# Patient Record
Sex: Male | Born: 1950 | Race: Black or African American | Hispanic: No | Marital: Single | Smoking: Current every day smoker
Health system: Southern US, Community
[De-identification: ages and names within clinical notes are randomized; demographics above are authoritative.]

## PROBLEM LIST (undated history)

## (undated) DIAGNOSIS — E7800 Pure hypercholesterolemia, unspecified: Secondary | ICD-10-CM

## (undated) DIAGNOSIS — I2510 Atherosclerotic heart disease of native coronary artery without angina pectoris: Secondary | ICD-10-CM

## (undated) DIAGNOSIS — F1910 Other psychoactive substance abuse, uncomplicated: Secondary | ICD-10-CM

## (undated) DIAGNOSIS — I255 Ischemic cardiomyopathy: Secondary | ICD-10-CM

## (undated) DIAGNOSIS — G459 Transient cerebral ischemic attack, unspecified: Secondary | ICD-10-CM

## (undated) DIAGNOSIS — I219 Acute myocardial infarction, unspecified: Secondary | ICD-10-CM

## (undated) DIAGNOSIS — I639 Cerebral infarction, unspecified: Secondary | ICD-10-CM

## (undated) DIAGNOSIS — I509 Heart failure, unspecified: Secondary | ICD-10-CM

## (undated) DIAGNOSIS — D649 Anemia, unspecified: Secondary | ICD-10-CM

## (undated) DIAGNOSIS — Z8679 Personal history of other diseases of the circulatory system: Secondary | ICD-10-CM

## (undated) HISTORY — PX: MITRAL VALVE REPLACEMENT: SHX147

---

## 1999-02-22 ENCOUNTER — Emergency Department (HOSPITAL_COMMUNITY): Admission: EM | Admit: 1999-02-22 | Discharge: 1999-02-22 | Payer: Self-pay | Admitting: *Deleted

## 1999-02-22 ENCOUNTER — Encounter: Payer: Self-pay | Admitting: *Deleted

## 2004-08-13 DIAGNOSIS — I219 Acute myocardial infarction, unspecified: Secondary | ICD-10-CM

## 2004-08-13 HISTORY — DX: Acute myocardial infarction, unspecified: I21.9

## 2004-08-24 ENCOUNTER — Emergency Department (HOSPITAL_COMMUNITY): Admission: EM | Admit: 2004-08-24 | Discharge: 2004-08-25 | Payer: Self-pay | Admitting: Emergency Medicine

## 2004-09-03 ENCOUNTER — Emergency Department (HOSPITAL_COMMUNITY): Admission: EM | Admit: 2004-09-03 | Discharge: 2004-09-03 | Payer: Self-pay | Admitting: Emergency Medicine

## 2004-12-19 ENCOUNTER — Ambulatory Visit: Payer: Self-pay | Admitting: Internal Medicine

## 2004-12-19 ENCOUNTER — Ambulatory Visit: Payer: Self-pay | Admitting: Dentistry

## 2004-12-19 ENCOUNTER — Inpatient Hospital Stay (HOSPITAL_COMMUNITY): Admission: EM | Admit: 2004-12-19 | Discharge: 2005-01-31 | Payer: Self-pay | Admitting: Emergency Medicine

## 2004-12-20 ENCOUNTER — Ambulatory Visit: Payer: Self-pay | Admitting: Cardiology

## 2005-01-17 ENCOUNTER — Ambulatory Visit: Payer: Self-pay | Admitting: Internal Medicine

## 2005-03-21 ENCOUNTER — Emergency Department (HOSPITAL_COMMUNITY): Admission: EM | Admit: 2005-03-21 | Discharge: 2005-03-21 | Payer: Self-pay | Admitting: Emergency Medicine

## 2011-10-24 ENCOUNTER — Emergency Department (HOSPITAL_COMMUNITY)
Admission: EM | Admit: 2011-10-24 | Discharge: 2011-10-24 | Disposition: A | Discharge status: Home / Self Care | Attending: Emergency Medicine | Admitting: Emergency Medicine

## 2011-10-24 ENCOUNTER — Emergency Department (HOSPITAL_COMMUNITY)

## 2011-10-24 ENCOUNTER — Encounter (HOSPITAL_COMMUNITY): Payer: Self-pay | Admitting: Emergency Medicine

## 2011-10-24 ENCOUNTER — Other Ambulatory Visit: Payer: Self-pay

## 2011-10-24 DIAGNOSIS — R202 Paresthesia of skin: Secondary | ICD-10-CM

## 2011-10-24 DIAGNOSIS — I2510 Atherosclerotic heart disease of native coronary artery without angina pectoris: Secondary | ICD-10-CM | POA: Insufficient documentation

## 2011-10-24 DIAGNOSIS — I252 Old myocardial infarction: Secondary | ICD-10-CM | POA: Insufficient documentation

## 2011-10-24 DIAGNOSIS — I671 Cerebral aneurysm, nonruptured: Secondary | ICD-10-CM

## 2011-10-24 DIAGNOSIS — I10 Essential (primary) hypertension: Secondary | ICD-10-CM | POA: Insufficient documentation

## 2011-10-24 DIAGNOSIS — R209 Unspecified disturbances of skin sensation: Secondary | ICD-10-CM | POA: Insufficient documentation

## 2011-10-24 DIAGNOSIS — Z8673 Personal history of transient ischemic attack (TIA), and cerebral infarction without residual deficits: Secondary | ICD-10-CM | POA: Insufficient documentation

## 2011-10-24 HISTORY — DX: Anemia, unspecified: D64.9

## 2011-10-24 HISTORY — DX: Heart failure, unspecified: I50.9

## 2011-10-24 HISTORY — DX: Ischemic cardiomyopathy: I25.5

## 2011-10-24 HISTORY — DX: Acute myocardial infarction, unspecified: I21.9

## 2011-10-24 HISTORY — DX: Cerebral infarction, unspecified: I63.9

## 2011-10-24 HISTORY — DX: Transient cerebral ischemic attack, unspecified: G45.9

## 2011-10-24 HISTORY — DX: Pure hypercholesterolemia, unspecified: E78.00

## 2011-10-24 HISTORY — DX: Atherosclerotic heart disease of native coronary artery without angina pectoris: I25.10

## 2011-10-24 HISTORY — DX: Other psychoactive substance abuse, uncomplicated: F19.10

## 2011-10-24 HISTORY — DX: Personal history of other diseases of the circulatory system: Z86.79

## 2011-10-24 LAB — BASIC METABOLIC PANEL
CO2: 25 mEq/L (ref 19–32)
Chloride: 103 mEq/L (ref 96–112)
Sodium: 140 mEq/L (ref 135–145)

## 2011-10-24 LAB — DIFFERENTIAL
Basophils Absolute: 0 10*3/uL (ref 0.0–0.1)
Lymphocytes Relative: 33 % (ref 12–46)
Lymphs Abs: 3.7 10*3/uL (ref 0.7–4.0)
Neutro Abs: 6.5 10*3/uL (ref 1.7–7.7)

## 2011-10-24 LAB — RAPID URINE DRUG SCREEN, HOSP PERFORMED
Amphetamines: NOT DETECTED
Benzodiazepines: NOT DETECTED
Cocaine: NOT DETECTED
Opiates: NOT DETECTED
Tetrahydrocannabinol: POSITIVE — AB

## 2011-10-24 LAB — CBC
HCT: 50.9 % (ref 39.0–52.0)
Platelets: 220 10*3/uL (ref 150–400)
RBC: 5.68 MIL/uL (ref 4.22–5.81)
RDW: 12.9 % (ref 11.5–15.5)
WBC: 11.2 10*3/uL — ABNORMAL HIGH (ref 4.0–10.5)

## 2011-10-24 MED ORDER — HYDROCHLOROTHIAZIDE 12.5 MG PO TABS: 25.0000 mg | ORAL_TABLET | Freq: Every day | ORAL | Status: AC

## 2011-10-24 MED ORDER — GADOBENATE DIMEGLUMINE 529 MG/ML IV SOLN
17.0000 mL | Freq: Once | INTRAVENOUS | Status: AC | PRN
  Administered 2011-10-24: 17 mL via INTRAVENOUS

## 2011-10-24 NOTE — Discharge Instructions (Signed)
Important to keep your blood pressure in control. Start blood pressure medication but follow up with your primary care doctor in the near future for blood pressure recheck. Call Dr. Sueanne Margarita office tomorrow morning to schedule close follow up. Return to ER for changing or worsening of symptoms.  Intracerebral Aneurysm An intracerebral aneurysm is a small, thin, walled out-pouching or enlargement of one of the large blood vessels that supply blood to the brain. Aneurysms are a risk to health because they may leak or break and bleed into the brain. They often occur where the larger vessels branch near the base of the brain. Leaking or rupture can also occur into the fluid-filled spaces that surround the brain (the subarachnoid space).  Rupture of an aneurysm leads to subarachnoid hemorrhage [facility]). This occurs most often in patients between 53 and 61 years of age. It occurs equally between men and women. Cigarette smoking and excess alcohol use has been shown to increase the risk of rupture. Intracerebral aneurysms are associated with other kidney, blood vessel, and muscle diseases. High blood pressure is also a risk factor, but it seems to be less important, since aneurysms often occur in persons with normal blood pressure. Pregnancy has not been associated with an increased incidence of this problem. CAUSES   An accident (trauma), infection, or cancer disease.   A problem with development of the lining of the artery. This causes a thinning that is weaker than the rest of the artery. This can rupture or leak.   A small percentage the problem is passed down from parents (hereditary).  SYMPTOMS   Prior to rupture, most aneurysms produce no problems. Sometimes, if the aneurysm is getting large, it may cause problems due to increased pressure on the brain. Some of these problems may be:   Double vision.   Loss of vision.   Numbness or pain in the face.   Enlarged pupil size.   A drooping eyelid.     Headache.   Usually, patients who have an aneurysm rupture experience sudden onset of a severe headache, often described as "the worst headache of my life." This can happen along with the loss of consciousness (fainting) and sometimes vomiting. A stiff neck often follows.   Rupture of an aneurysm usually occurs while the person is active rather than during sleep. In as many as half of cases, patients experience a warning headache due to a small leakage of blood. This can precede a major bleed by several hours to several weeks.   These milder headaches are often associated with feeling sick to your stomach (nausea) and vomiting. These warning headaches may be mistaken for migraine headaches.  DIAGNOSIS   A CT scan of the head will usually confirm blood within the brain or in the space surrounding the brain. The blood will show up if an aneurysm has already bled or ruptured.   If the results of the CT scan are not certain, a spinal tap is sometimes used. This test looks for blood in the fluid which surrounds the brain and spinal cord (cerebrospinal fluid).   One way to diagnose an intracerebral aneurysm is to perform a test called Carotid and vertebral angiography. This is a test in which a liquid called a "contrast material" is injected into one of the arteries leading to your head. The contrast material makes the arteries look bright white on x-rays. The outlines of the arteries and any aneurysm will show up clearly. Angiography can be done in the CT scanner (  CT Angiography) or by puncturing the artery in the groin, advancing a catheter, and then injecting the contrast material.   Magnetic resonance imaging (MRI) and magnetic resonance angiography (MRA) are also used in detection of aneurysms.  RISKS AND COMPLICATIONS  If rupture occurs, about  of the patients survive. The best predictor of risk of rupture is the size of the aneurysm.   A guide to the likely outcome (prognosis) is determined  by the patient's level of consciousness and neurologic problems when first examined upon arrival to the hospital.  After a rupture of an intracerebral aneurysm, the following may occur:  The cerebral blood vessels get smaller in size (cerebral vasospasm). This can decrease the blood supply to parts of the brain, causing a stroke.   Re-bleeding from the aneurysm.   Swelling of the ventricles in the brain (hydrocephalus).   Seizures (convulsions).   Rupture of an aneurysm can also affect heart and lung function. If these problems develop, they are assessed and treated.  All patients are monitored for the possible development of these problems. Treatment varies depending on what complications do or do not develop. TREATMENT FOR RUPTURED ANEURYSMS  Treatment of aneurysms is done with either conventional surgery or by an endovascular procedure.   Timing of treatment is an important factor in the prevention of complications. Successful early treatment of a ruptured aneurysm (within the first 3 days of a bleed) helps to prevent re-bleeding and vasospasm.   In some cases, there may be a reason to treat later (10-14 days after a rupture). Many things are considered when making this decision. These include an assessment of how much brain swelling is present. Each case is handled individually.  TREATMENT FOR UNRUPTURED ANEURYSMS Treatment of an aneurysm that is causing no problems (asymptomatic aneurysm), but was discovered by chance is complex. Many things must be considered. These include the size and exact location of the aneurysm. Small aneurysms in certain locations of the brain have a very low chance of bleeding or rupturing. Other things to consider are the patient's age, overall health, and their feelings and personal preferences. Each individual case is different. In general, if someone has an aneurysm that has not bled in the past, the risk of an operation is greater than the risk of watching and  monitoring. In those cases, the risk of an operation increases as the patient gets older. Surgery is usually recommended for large, easy to reach surgically, unruptured aneurysms. Again, each case must be assessed individually. Document Released: 07/20/2002 Document Revised: 07/19/2011 Document Reviewed: 11/17/2008 [facility] Patient Information 2012 [place], [place].  Hypertension Hypertension is another name for high blood pressure. High blood pressure may mean that your heart needs to work harder to pump blood. Blood pressure consists of two numbers, which includes a higher number over a lower number (example: 110/72). HOME CARE   Make lifestyle changes as told by your doctor. This may include weight loss and exercise.   Take your blood pressure medicine every day.   Limit how much salt you use.   Stop smoking if you smoke.   Do not use drugs.   Talk to your doctor if you are using decongestants or birth control pills. These medicines might make blood pressure higher.   Females should not drink more than 1 alcoholic drink per day. Males should not drink more than 2 alcoholic drinks per day.   See your doctor as told.  GET HELP RIGHT AWAY IF:   You have a blood  pressure reading with a top number of 180 or higher.   You get a very bad headache.   You get blurred or changing vision.   You feel confused.   You feel weak, numb, or faint.   You get chest or belly (abdominal) pain.   You throw up (vomit).   You cannot breathe very well.  MAKE SURE YOU:   Understand these instructions.   Will watch your condition.   Will get help right away if you are not doing well or get worse.  Document Released: 01/16/2008 Document Revised: 07/19/2011 Document Reviewed: 01/16/2008 [facility] Patient Information 2012 [place], [place].

## 2011-10-24 NOTE — ED Provider Notes (Signed)
History     CSN: [id number]  Arrival date & time 10/24/11  1219   First MD Initiated Contact with Patient 10/24/11 1519      Chief Complaint  Patient presents with  . Weakness    rt arm weak for 4 days now no injury denies any weakness to leg ot any other area alert x4, no slurred speech, no headache, able to move arm strong pulses     (Consider location/radiation/quality/duration/timing/severity/associated sxs/prior treatment) HPI  Patient with hx of stroke, endocarditis, MI and hx of polysubstance abuse is brought to ER by his niece with concern of a 4 day hx of gradual onset right upper arm pain with movement and a 2 day hx of waxing and waning, intermittent tingling in finger tips of right hand. Patient denies known injury or strain to right arm but states "it has been hurting in the bicep with movement." patient states he told his niece today about the pain and the waxing and waning of tingling in his right finger tips and she became concerned with hx of stroke and brought her to ER. He denies fevers, chills, HA, visual changes, dizziness, neck pain or stiffness, rash, CP, SOB, abdominal pain, extremity weakness, facial droop, slurred speech, confused thoughts, difficulty ambulating or loss of coordination. Patient states pain is aggravated by movement of elbow and shoulder. Denies aggravating or alleviating factors for tingling in finger tips. Has taken nothing for pain PTA.   Past Medical History  Diagnosis Date  . Hypercholesteremia   . Transient ischemic attack, acute   . Hypertension   . Coronary artery disease   . Myocardial infarction 2006    Past Surgical History  Procedure Date  . Mitral valve replacement     No family history on file.  History  Substance Use Topics  . Smoking status: Not on file  . Smokeless tobacco: Not on file  . Alcohol Use:       Review of Systems  All other systems reviewed and are negative.    Allergies  Review of patient's  allergies indicates no known allergies.  Home Medications  No current outpatient prescriptions on file.  BP 154/101  Pulse 79  Temp(Src) 99.5 F (37.5 C) (Oral)  Resp 20  SpO2 98%  Physical Exam  Nursing note and vitals reviewed. Constitutional: He is oriented to person, place, and time. He appears well-developed and well-nourished. No distress.  HENT:  Head: Normocephalic and atraumatic.  Eyes: Conjunctivae and EOM are normal. Pupils are equal, round, and reactive to light.  Neck: Normal range of motion. Neck supple.  Cardiovascular: Normal rate, regular rhythm, normal heart sounds and intact distal pulses.  Exam reveals no gallop and no friction rub.   No murmur heard. Pulmonary/Chest: Effort normal and breath sounds normal. No respiratory distress. He has no wheezes. He has no rales. He exhibits no tenderness.  Abdominal: Soft. Bowel sounds are normal. He exhibits no distension and no mass. There is no tenderness. There is no rebound and no guarding.  Musculoskeletal: Normal range of motion. He exhibits tenderness. He exhibits no edema.       Mild TTP of right bicep/soft tissue of right upper arm but no skin changes, erythema, edema or crepitous. No TTP of right shoulder or elbow. FROM of right upper extremity with little or no pain. 5/5 strength bilaterally of UE and LE. Good reflexes.   Neurological: He is alert and oriented to person, place, and time. No cranial nerve deficit. Coordination normal.  Skin: Skin is warm and dry. No rash noted. He is not diaphoretic. No erythema.  Psychiatric: He has a normal mood and affect.    ED Course  Procedures (including critical care time)   Date: 10/24/2011  Rate:   Rhythm: normal sinus rhythm  QRS Axis: normal  Intervals: normal  ST/T Wave abnormalities: non specific T wave changes.   Conduction Disutrbances: none  Narrative Interpretation:   Old EKG Reviewed: non provocative EKG compared to Jan 10, 2005, No significant changes  noted    Labs Reviewed  CBC - Abnormal; Notable for the following:    WBC 11.2 (*)    Hemoglobin 17.7 (*)    All other components within normal limits  URINE RAPID DRUG SCREEN (HOSP PERFORMED) - Abnormal; Notable for the following:    Tetrahydrocannabinol POSITIVE (*)    All other components within normal limits  DIFFERENTIAL  BASIC METABOLIC PANEL  TROPONIN I   Mr Shirlee Latch Wo Contrast  10/24/2011  *RADIOLOGY REPORT*  Clinical Data: Right arm weakness.  Abnormal MRI of the brain.  MRA HEAD WITHOUT CONTRAST  Technique: Angiographic images of the Circle of Willis were obtained using MRA technique without intravenous contrast.  Comparison: MRI same day  Findings: This study was done after previous administration of contrast.  Both internal carotid arteries are patent into the brain.  The right internal carotid artery supplies only the right middle cerebral artery territory.  There is no right MCA stenosis or aneurysm.  The left internal carotid artery supplies the left middle cerebral artery territory and both anterior cerebral artery territories. 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  Both vertebral arteries are widely patent to the basilar.  No basilar stenosis.  Posterior circulation branch vessels are patent.  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  Original Report Authenticated By: Thomasenia Sales, M.D.   Mr Laqueta Jean RU Contrast  10/24/2011  *RADIOLOGY REPORT*  Clinical Data: Right arm weakness.  History of previous infarct.  MRI HEAD WITHOUT AND WITH CONTRAST  Technique:  Multiplanar, multiecho pulse sequences of the brain and surrounding structures were obtained according to standard protocol without and with intravenous contrast  Contrast: 17mL MULTIHANCE GADOBENATE DIMEGLUMINE 529 MG/ML IV SOLN  Comparison: CT head without contrast 01/08/2005.  MRI brain 12/20/2004.  Findings: The diffusion weighted images  demonstrate no evidence for acute or subacute infarction.  Multiple remote infarcts, including the right occipital lobe, bilateral thalami, right cerebellum, and left frontal lobe are again noted.  The signal changes have evolved since the acute imaging.  No hemorrhage or mass lesion is present.  Mild white matter changes are present within the brain stem, potentially representing wallerian degeneration.  Flow is present in the major intracranial arteries.  The globes and orbits are intact.  The paranasal sinuses and mastoid air cells are clear.  A 6 mm rounded area of enhancement is noted just anterior to the corpus callosum on image 38 of series 10.  This appears to be along the course of the pericallosal artery and is concerning for a small aneurysm.  The area is subjacent to the focal remote medial left frontal lobe infarct.  The aneurysm is just below the superior limb of the field of view from the prior MRI.  IMPRESSION:  1.  No acute infarct. 2.  Evolution of multiple bilateral remote infarcts. 3.  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  Original Report Authenticated By: Jamesetta Orleans. MATTERN, M.D.     No diagnosis found.  8:23 PM I spoke with Dr. Jeanne Ivan who is oncall for neurology for [facility] who states MRA results and symptoms are not consistent with TIA and from a TIA/stroke stand point that the patient needs BP medication and close follow up with PCP and neurology but that the aneurysm needs to be discussed with neurosurg to discuss follow up. Patient denies HA or weakness of arm. Ongoing tingling in fingers but no weakness. Dr. Jeanne Ivan states symptoms are much more consistent with possible "pinched nerve in neck."   Dr. Clarene Duke to speak with oncall neurosurg.   9:03 PM Dr. Franky Macho requesting that patient call his office first thing in the morning to establish close followup for ongoing evaluation and management of  his aneurysm which appears stable on MRA. Patient is agreeable to plan.   MDM  Patient's tingling in fingertips more likely to be associated with a questionable pinched nerve versus a TIA with pain in MCA and carotid arteries. No acute findings of stroke on MRI MRA. Stable appearing aneurysm be followed by a neurosurgery closely. He has no neuro focal findings. He is ambulating without difficulty and without ataxia.        Jenness Corner, [place] 10/24/11 2104

## 2011-10-24 NOTE — ED Notes (Signed)
Pt's niece came to the desk, reports that pt has had TIA in the past.  Pt is reporting tingling in his R arm.  States that pt probably needs to be be ruled out for TIA again.  Tiffany EDPA and Daine CN notified.

## 2011-10-24 NOTE — ED Notes (Signed)
Transported back from MRI

## 2011-10-24 NOTE — ED Notes (Signed)
Patient transported to MRI per [facility] triage nurse

## 2011-10-24 NOTE — ED Notes (Signed)
Pt's niece reports that pt does not have hx of HTN.  Pt was talking about running out of his beta-blocker med.

## 2011-10-24 NOTE — ED Notes (Signed)
Pt ambulated to the BR with steady gait.   

## 2011-10-24 NOTE — ED Notes (Signed)
Pt is still in MRI.

## 2011-10-24 NOTE — ED Notes (Signed)
Pt ambulated to the room with steady gait.

## 2011-10-24 NOTE — Progress Notes (Signed)
Pt listed as self pay with no insurance coverage Pt confirms he has VA benefits. CM and [facility] coordinator spoke with him

## 2011-10-25 NOTE — ED Provider Notes (Signed)
Medical screening examination/treatment/procedure(s) were performed by non-physician practitioner and as supervising physician I was immediately available for consultation/collaboration.   Laray Anger, DO 10/25/11 1559

## 2012-10-09 ENCOUNTER — Other Ambulatory Visit: Payer: Self-pay | Admitting: Neurosurgery

## 2012-10-09 DIAGNOSIS — I729 Aneurysm of unspecified site: Secondary | ICD-10-CM

## 2012-10-20 ENCOUNTER — Other Ambulatory Visit

## 2012-11-05 ENCOUNTER — Ambulatory Visit
Admission: RE | Admit: 2012-11-05 | Discharge: 2012-11-05 | Disposition: A | Discharge status: Home / Self Care | Source: Ambulatory Visit | Attending: Neurosurgery | Admitting: Neurosurgery

## 2012-11-05 DIAGNOSIS — I729 Aneurysm of unspecified site: Secondary | ICD-10-CM

## 2012-11-05 MED ORDER — GADOBENATE DIMEGLUMINE 529 MG/ML IV SOLN
16.0000 mL | Freq: Once | INTRAVENOUS | Status: AC | PRN
  Administered 2012-11-05: 16 mL via INTRAVENOUS

## 2015-03-14 DEATH — deceased

## 2018-12-12 ENCOUNTER — Emergency Department (HOSPITAL_COMMUNITY): Payer: Medicare Other

## 2018-12-12 ENCOUNTER — Other Ambulatory Visit: Payer: Self-pay

## 2018-12-12 ENCOUNTER — Inpatient Hospital Stay (HOSPITAL_COMMUNITY)
Admission: EM | Admit: 2018-12-12 | Discharge: 2019-01-12 | DRG: 291 | Disposition: E | Payer: Medicare Other | Attending: Pulmonary Disease | Admitting: Pulmonary Disease

## 2018-12-12 ENCOUNTER — Encounter (HOSPITAL_COMMUNITY): Payer: Self-pay | Admitting: Emergency Medicine

## 2018-12-12 DIAGNOSIS — N182 Chronic kidney disease, stage 2 (mild): Secondary | ICD-10-CM | POA: Diagnosis present

## 2018-12-12 DIAGNOSIS — T82524A Displacement of infusion catheter, initial encounter: Secondary | ICD-10-CM | POA: Diagnosis not present

## 2018-12-12 DIAGNOSIS — Z8673 Personal history of transient ischemic attack (TIA), and cerebral infarction without residual deficits: Secondary | ICD-10-CM | POA: Diagnosis not present

## 2018-12-12 DIAGNOSIS — I5043 Acute on chronic combined systolic (congestive) and diastolic (congestive) heart failure: Secondary | ICD-10-CM | POA: Diagnosis present

## 2018-12-12 DIAGNOSIS — D631 Anemia in chronic kidney disease: Secondary | ICD-10-CM | POA: Diagnosis present

## 2018-12-12 DIAGNOSIS — Z7902 Long term (current) use of antithrombotics/antiplatelets: Secondary | ICD-10-CM | POA: Diagnosis not present

## 2018-12-12 DIAGNOSIS — R570 Cardiogenic shock: Secondary | ICD-10-CM | POA: Diagnosis not present

## 2018-12-12 DIAGNOSIS — E785 Hyperlipidemia, unspecified: Secondary | ICD-10-CM | POA: Diagnosis present

## 2018-12-12 DIAGNOSIS — I130 Hypertensive heart and chronic kidney disease with heart failure and stage 1 through stage 4 chronic kidney disease, or unspecified chronic kidney disease: Principal | ICD-10-CM | POA: Diagnosis present

## 2018-12-12 DIAGNOSIS — Z515 Encounter for palliative care: Secondary | ICD-10-CM | POA: Diagnosis not present

## 2018-12-12 DIAGNOSIS — J441 Chronic obstructive pulmonary disease with (acute) exacerbation: Secondary | ICD-10-CM | POA: Diagnosis present

## 2018-12-12 DIAGNOSIS — I110 Hypertensive heart disease with heart failure: Secondary | ICD-10-CM | POA: Diagnosis not present

## 2018-12-12 DIAGNOSIS — I480 Paroxysmal atrial fibrillation: Secondary | ICD-10-CM

## 2018-12-12 DIAGNOSIS — I2510 Atherosclerotic heart disease of native coronary artery without angina pectoris: Secondary | ICD-10-CM | POA: Diagnosis present

## 2018-12-12 DIAGNOSIS — R6521 Severe sepsis with septic shock: Secondary | ICD-10-CM | POA: Diagnosis not present

## 2018-12-12 DIAGNOSIS — I5023 Acute on chronic systolic (congestive) heart failure: Secondary | ICD-10-CM | POA: Diagnosis not present

## 2018-12-12 DIAGNOSIS — J9601 Acute respiratory failure with hypoxia: Secondary | ICD-10-CM | POA: Diagnosis present

## 2018-12-12 DIAGNOSIS — E440 Moderate protein-calorie malnutrition: Secondary | ICD-10-CM | POA: Diagnosis present

## 2018-12-12 DIAGNOSIS — I255 Ischemic cardiomyopathy: Secondary | ICD-10-CM | POA: Diagnosis present

## 2018-12-12 DIAGNOSIS — A021 Salmonella sepsis: Secondary | ICD-10-CM | POA: Diagnosis not present

## 2018-12-12 DIAGNOSIS — J810 Acute pulmonary edema: Secondary | ICD-10-CM | POA: Diagnosis not present

## 2018-12-12 DIAGNOSIS — I252 Old myocardial infarction: Secondary | ICD-10-CM

## 2018-12-12 DIAGNOSIS — R7881 Bacteremia: Secondary | ICD-10-CM | POA: Diagnosis not present

## 2018-12-12 DIAGNOSIS — Z978 Presence of other specified devices: Secondary | ICD-10-CM

## 2018-12-12 DIAGNOSIS — Z20828 Contact with and (suspected) exposure to other viral communicable diseases: Secondary | ICD-10-CM | POA: Diagnosis present

## 2018-12-12 DIAGNOSIS — Z01818 Encounter for other preprocedural examination: Secondary | ICD-10-CM

## 2018-12-12 DIAGNOSIS — N170 Acute kidney failure with tubular necrosis: Secondary | ICD-10-CM | POA: Diagnosis present

## 2018-12-12 DIAGNOSIS — Z79899 Other long term (current) drug therapy: Secondary | ICD-10-CM

## 2018-12-12 DIAGNOSIS — N179 Acute kidney failure, unspecified: Secondary | ICD-10-CM | POA: Diagnosis not present

## 2018-12-12 DIAGNOSIS — E872 Acidosis: Secondary | ICD-10-CM | POA: Diagnosis not present

## 2018-12-12 DIAGNOSIS — E875 Hyperkalemia: Secondary | ICD-10-CM | POA: Diagnosis not present

## 2018-12-12 DIAGNOSIS — J9690 Respiratory failure, unspecified, unspecified whether with hypoxia or hypercapnia: Secondary | ICD-10-CM

## 2018-12-12 DIAGNOSIS — A419 Sepsis, unspecified organism: Secondary | ICD-10-CM | POA: Diagnosis not present

## 2018-12-12 DIAGNOSIS — Z952 Presence of prosthetic heart valve: Secondary | ICD-10-CM | POA: Diagnosis not present

## 2018-12-12 DIAGNOSIS — D509 Iron deficiency anemia, unspecified: Secondary | ICD-10-CM | POA: Diagnosis present

## 2018-12-12 DIAGNOSIS — E876 Hypokalemia: Secondary | ICD-10-CM | POA: Diagnosis not present

## 2018-12-12 DIAGNOSIS — I671 Cerebral aneurysm, nonruptured: Secondary | ICD-10-CM | POA: Diagnosis not present

## 2018-12-12 DIAGNOSIS — L8990 Pressure ulcer of unspecified site, unspecified stage: Secondary | ICD-10-CM | POA: Diagnosis present

## 2018-12-12 DIAGNOSIS — I361 Nonrheumatic tricuspid (valve) insufficiency: Secondary | ICD-10-CM | POA: Diagnosis not present

## 2018-12-12 DIAGNOSIS — J449 Chronic obstructive pulmonary disease, unspecified: Secondary | ICD-10-CM | POA: Diagnosis not present

## 2018-12-12 DIAGNOSIS — Z953 Presence of xenogenic heart valve: Secondary | ICD-10-CM

## 2018-12-12 DIAGNOSIS — Z720 Tobacco use: Secondary | ICD-10-CM | POA: Diagnosis not present

## 2018-12-12 DIAGNOSIS — D65 Disseminated intravascular coagulation [defibrination syndrome]: Secondary | ICD-10-CM | POA: Diagnosis present

## 2018-12-12 DIAGNOSIS — I371 Nonrheumatic pulmonary valve insufficiency: Secondary | ICD-10-CM | POA: Diagnosis not present

## 2018-12-12 DIAGNOSIS — A4150 Gram-negative sepsis, unspecified: Secondary | ICD-10-CM | POA: Diagnosis not present

## 2018-12-12 DIAGNOSIS — I5082 Biventricular heart failure: Secondary | ICD-10-CM | POA: Diagnosis not present

## 2018-12-12 DIAGNOSIS — D72829 Elevated white blood cell count, unspecified: Secondary | ICD-10-CM | POA: Diagnosis not present

## 2018-12-12 DIAGNOSIS — Z6823 Body mass index (BMI) 23.0-23.9, adult: Secondary | ICD-10-CM

## 2018-12-12 DIAGNOSIS — Z452 Encounter for adjustment and management of vascular access device: Secondary | ICD-10-CM

## 2018-12-12 DIAGNOSIS — I050 Rheumatic mitral stenosis: Secondary | ICD-10-CM | POA: Diagnosis present

## 2018-12-12 DIAGNOSIS — F17210 Nicotine dependence, cigarettes, uncomplicated: Secondary | ICD-10-CM | POA: Diagnosis present

## 2018-12-12 DIAGNOSIS — I509 Heart failure, unspecified: Secondary | ICD-10-CM | POA: Diagnosis not present

## 2018-12-12 DIAGNOSIS — R011 Cardiac murmur, unspecified: Secondary | ICD-10-CM | POA: Diagnosis not present

## 2018-12-12 DIAGNOSIS — D696 Thrombocytopenia, unspecified: Secondary | ICD-10-CM | POA: Diagnosis present

## 2018-12-12 LAB — CBC WITH DIFFERENTIAL/PLATELET
Abs Immature Granulocytes: 0.27 10*3/uL — ABNORMAL HIGH (ref 0.00–0.07)
Basophils Absolute: 0 10*3/uL (ref 0.0–0.1)
Basophils Relative: 0 %
Eosinophils Absolute: 0 10*3/uL (ref 0.0–0.5)
Eosinophils Relative: 0 %
HCT: 38.3 % — ABNORMAL LOW (ref 39.0–52.0)
Hemoglobin: 10.4 g/dL — ABNORMAL LOW (ref 13.0–17.0)
Immature Granulocytes: 1 %
Lymphocytes Relative: 1 %
Lymphs Abs: 0.1 10*3/uL — ABNORMAL LOW (ref 0.7–4.0)
MCH: 20.7 pg — ABNORMAL LOW (ref 26.0–34.0)
MCHC: 27.2 g/dL — ABNORMAL LOW (ref 30.0–36.0)
MCV: 76.3 fL — ABNORMAL LOW (ref 80.0–100.0)
Monocytes Absolute: 1.6 10*3/uL — ABNORMAL HIGH (ref 0.1–1.0)
Monocytes Relative: 6 %
Neutro Abs: 24.5 10*3/uL — ABNORMAL HIGH (ref 1.7–7.7)
Neutrophils Relative %: 92 %
Platelets: 219 10*3/uL (ref 150–400)
RBC: 5.02 MIL/uL (ref 4.22–5.81)
RDW: 21.9 % — ABNORMAL HIGH (ref 11.5–15.5)
WBC: 26.5 10*3/uL — ABNORMAL HIGH (ref 4.0–10.5)
nRBC: 2.3 % — ABNORMAL HIGH (ref 0.0–0.2)

## 2018-12-12 LAB — BASIC METABOLIC PANEL
Anion gap: 19 — ABNORMAL HIGH (ref 5–15)
BUN: 36 mg/dL — ABNORMAL HIGH (ref 8–23)
CO2: 14 mmol/L — ABNORMAL LOW (ref 22–32)
Calcium: 9 mg/dL (ref 8.9–10.3)
Chloride: 108 mmol/L (ref 98–111)
Creatinine, Ser: 1.58 mg/dL — ABNORMAL HIGH (ref 0.61–1.24)
GFR calc Af Amer: 52 mL/min — ABNORMAL LOW (ref >60)
GFR calc non Af Amer: 45 mL/min — ABNORMAL LOW (ref >60)
Glucose, Bld: 99 mg/dL (ref 70–99)
Potassium: 4.6 mmol/L (ref 3.5–5.1)
Sodium: 141 mmol/L (ref 135–145)

## 2018-12-12 LAB — HEPATIC FUNCTION PANEL
ALT: <5 U/L (ref 0–44)
AST: 53 U/L — ABNORMAL HIGH (ref 15–41)
Albumin: 2.9 g/dL — ABNORMAL LOW (ref 3.5–5.0)
Alkaline Phosphatase: 84 U/L (ref 38–126)
Bilirubin, Direct: 0.7 mg/dL — ABNORMAL HIGH (ref 0.0–0.2)
Indirect Bilirubin: 1.5 mg/dL — ABNORMAL HIGH (ref 0.3–0.9)
Total Bilirubin: 2.2 mg/dL — ABNORMAL HIGH (ref 0.3–1.2)
Total Protein: 6.4 g/dL — ABNORMAL LOW (ref 6.5–8.1)

## 2018-12-12 LAB — TROPONIN I
Troponin I: 0.09 ng/mL (ref <0.03)
Troponin I: 0.09 ng/mL (ref <0.03)

## 2018-12-12 LAB — POCT I-STAT 7, (LYTES, BLD GAS, ICA,H+H)
Acid-base deficit: 5 mmol/L — ABNORMAL HIGH (ref 0.0–2.0)
Bicarbonate: 17.3 mmol/L — ABNORMAL LOW (ref 20.0–28.0)
Calcium, Ion: 1.22 mmol/L (ref 1.15–1.40)
HCT: 29 % — ABNORMAL LOW (ref 39.0–52.0)
Hemoglobin: 9.9 g/dL — ABNORMAL LOW (ref 13.0–17.0)
O2 Saturation: 98 %
Patient temperature: 98.1
Potassium: 3.8 mmol/L (ref 3.5–5.1)
Sodium: 138 mmol/L (ref 135–145)
TCO2: 18 mmol/L — ABNORMAL LOW (ref 22–32)
pCO2 arterial: 23.6 mmHg — ABNORMAL LOW (ref 32.0–48.0)
pH, Arterial: 7.472 — ABNORMAL HIGH (ref 7.350–7.450)
pO2, Arterial: 92 mmHg (ref 83.0–108.0)

## 2018-12-12 LAB — BRAIN NATRIURETIC PEPTIDE: B Natriuretic Peptide: 1859.8 pg/mL — ABNORMAL HIGH (ref 0.0–100.0)

## 2018-12-12 LAB — SARS CORONAVIRUS 2 BY RT PCR (HOSPITAL ORDER, PERFORMED IN ~~LOC~~ HOSPITAL LAB): SARS Coronavirus 2: NEGATIVE

## 2018-12-12 MED ORDER — ATORVASTATIN CALCIUM 40 MG PO TABS
40.0000 mg | ORAL_TABLET | Freq: Every day | ORAL | Status: DC
  Administered 2018-12-13: 40 mg via ORAL
  Filled 2018-12-12 (×2): qty 1

## 2018-12-12 MED ORDER — PANTOPRAZOLE SODIUM 40 MG PO TBEC
40.0000 mg | DELAYED_RELEASE_TABLET | Freq: Every evening | ORAL | Status: DC
  Administered 2018-12-13 (×2): 40 mg via ORAL
  Filled 2018-12-12 (×3): qty 1

## 2018-12-12 MED ORDER — ALBUTEROL SULFATE HFA 108 (90 BASE) MCG/ACT IN AERS
INHALATION_SPRAY | RESPIRATORY_TRACT | Status: AC
  Filled 2018-12-12: qty 6.7

## 2018-12-12 MED ORDER — SENNOSIDES-DOCUSATE SODIUM 8.6-50 MG PO TABS: 1.0000 | ORAL_TABLET | Freq: Every evening | ORAL | Status: DC | PRN

## 2018-12-12 MED ORDER — PROMETHAZINE HCL 25 MG PO TABS: 12.5000 mg | ORAL_TABLET | Freq: Four times a day (QID) | ORAL | Status: DC | PRN

## 2018-12-12 MED ORDER — TERBUTALINE SULFATE 1 MG/ML IJ SOLN
0.2500 mg | Freq: Once | INTRAMUSCULAR | Status: AC
  Administered 2018-12-12: 0.25 mg via SUBCUTANEOUS
  Filled 2018-12-12: qty 1

## 2018-12-12 MED ORDER — METHYLPREDNISOLONE SODIUM SUCC 125 MG IJ SOLR
125.0000 mg | Freq: Once | INTRAMUSCULAR | Status: AC
  Administered 2018-12-12: 125 mg via INTRAVENOUS
  Filled 2018-12-12: qty 2

## 2018-12-12 MED ORDER — ALBUTEROL SULFATE HFA 108 (90 BASE) MCG/ACT IN AERS
8.0000 | INHALATION_SPRAY | Freq: Once | RESPIRATORY_TRACT | Status: AC
  Administered 2018-12-12: 8 via RESPIRATORY_TRACT

## 2018-12-12 MED ORDER — ACETAMINOPHEN 325 MG PO TABS: 650.0000 mg | ORAL_TABLET | Freq: Four times a day (QID) | ORAL | Status: DC | PRN

## 2018-12-12 MED ORDER — IPRATROPIUM-ALBUTEROL 20-100 MCG/ACT IN AERS
1.0000 | INHALATION_SPRAY | Freq: Four times a day (QID) | RESPIRATORY_TRACT | Status: DC
  Administered 2018-12-12: 1 via RESPIRATORY_TRACT
  Filled 2018-12-12: qty 4

## 2018-12-12 MED ORDER — AEROCHAMBER PLUS FLO-VU LARGE MISC
Status: AC
  Administered 2018-12-12: 17:00:00
  Filled 2018-12-12: qty 1

## 2018-12-12 MED ORDER — MAGNESIUM SULFATE 2 GM/50ML IV SOLN
2.0000 g | Freq: Once | INTRAVENOUS | Status: AC
  Administered 2018-12-12: 2 g via INTRAVENOUS
  Filled 2018-12-12: qty 50

## 2018-12-12 MED ORDER — HEPARIN SODIUM (PORCINE) 5000 UNIT/ML IJ SOLN
5000.0000 [IU] | Freq: Three times a day (TID) | INTRAMUSCULAR | Status: DC
  Administered 2018-12-13 – 2018-12-14 (×4): 5000 [IU] via SUBCUTANEOUS
  Filled 2018-12-12 (×4): qty 1

## 2018-12-12 MED ORDER — ACETAMINOPHEN 650 MG RE SUPP: 650.0000 mg | Freq: Four times a day (QID) | RECTAL | Status: DC | PRN

## 2018-12-12 MED ORDER — FUROSEMIDE 10 MG/ML IJ SOLN
20.0000 mg | Freq: Once | INTRAMUSCULAR | Status: AC
  Administered 2018-12-12: 20 mg via INTRAVENOUS
  Filled 2018-12-12: qty 2

## 2018-12-12 MED ORDER — FUROSEMIDE 10 MG/ML IJ SOLN
40.0000 mg | Freq: Once | INTRAMUSCULAR | Status: DC
  Filled 2018-12-12: qty 4

## 2018-12-12 MED ORDER — AEROCHAMBER PLUS FLO-VU MISC
1.0000 | Freq: Once | Status: DC
  Filled 2018-12-12: qty 1

## 2018-12-12 MED ORDER — CLOPIDOGREL BISULFATE 75 MG PO TABS
75.0000 mg | ORAL_TABLET | Freq: Every evening | ORAL | Status: DC
  Administered 2018-12-13 (×2): 75 mg via ORAL
  Filled 2018-12-12 (×3): qty 1

## 2018-12-12 NOTE — ED Notes (Signed)
RN attempted to give report to 3E; pt is currently on BIPAP and may need different room assignment; RN to call back-Monique,RN

## 2018-12-12 NOTE — ED Provider Notes (Signed)
[facility] EMERGENCY DEPARTMENT Provider Note   CSN: [id number] Arrival date & time: 12/15/2018  1622    History   Chief Complaint Chief Complaint  Patient presents with  . Shortness of Breath    HPI Daniel Price is a 68 y.o. male.     68 yo M with a chief complaint of shortness of breath.  Patient states is been going on for about 6 months and is gotten progressively worse.  Is gotten to the point where he is having so much trouble that he felt he needed to come immediately to the hospital.  He denies cough or congestion denies fevers.  States that he has had significant swelling that has been worsening for the past year or so.  Denies history of heart failure denies history of COPD.  The history is provided by the patient.  Shortness of Breath  Severity:  Severe Onset quality:  Gradual Duration:  6 months Timing:  Constant Progression:  Worsening Chronicity:  New Relieved by:  Nothing Worsened by:  Nothing Ineffective treatments:  None tried Associated symptoms: no abdominal pain, no chest pain, no fever, no headaches, no rash and no vomiting     Past Medical History:  Diagnosis Date  . Anemia   . CHF (congestive heart failure) (HCC)   . Coronary artery disease   . History of SBE (subacute bacterial endocarditis)   . Hypercholesteremia   . Ischemic cardiomyopathy   . Myocardial infarction (HCC) 2006  . Polysubstance abuse (HCC)    Hx of  . Stroke (HCC)   . Transient ischemic attack, acute     There are no active problems to display for this patient.   Past Surgical History:  Procedure Laterality Date  . MITRAL VALVE REPLACEMENT          Home Medications    Prior to Admission medications   Not on File    Family History No family history on file.  Social History Social History   Tobacco Use  . Smoking status: Current Every Day Smoker    Packs/day: 0.50    Types: Cigarettes  . Smokeless tobacco: Never Used  Substance Use  Topics  . Alcohol use: Not on file  . Drug use: Not on file     Allergies   Patient has no known allergies.   Review of Systems Review of Systems  Unable to perform ROS: Severe respiratory distress  Constitutional: Negative for chills and fever.  HENT: Negative for congestion and facial swelling.   Eyes: Negative for discharge and visual disturbance.  Respiratory: Positive for shortness of breath.   Cardiovascular: Positive for leg swelling. Negative for chest pain and palpitations.  Gastrointestinal: Negative for abdominal pain, diarrhea and vomiting.  Musculoskeletal: Negative for arthralgias and myalgias.  Skin: Negative for color change and rash.  Neurological: Negative for tremors, syncope and headaches.  Psychiatric/Behavioral: Negative for confusion and dysphoric mood.     Physical Exam Updated Vital Signs BP 99/79   Pulse (!) 115   Temp 98.1 F (36.7 C) (Oral)   Resp (!) 29   SpO2 100%   Physical Exam Vitals signs and nursing note reviewed.  Constitutional:      Appearance: He is well-developed.  HENT:     Head: Normocephalic and atraumatic.  Eyes:     Pupils: Pupils are equal, round, and reactive to light.  Neck:     Musculoskeletal: Normal range of motion and neck supple.     Vascular:  JVD ( Just above the clavicle) present.  Cardiovascular:     Rate and Rhythm: Normal rate and regular rhythm.     Heart sounds: No murmur. No friction rub. No gallop.   Pulmonary:     Effort: Tachypnea and respiratory distress present.     Breath sounds: Wheezing present.     Comments: Good aeration with diffuse wheezes Abdominal:     General: There is no distension.     Tenderness: There is no guarding or rebound.  Musculoskeletal: Normal range of motion.     Right lower leg: Edema present.     Left lower leg: Edema present.     Comments: 4+ edema to the bilateral lower extremities  Skin:    Coloration: Skin is not pale.     Findings: No rash.  Neurological:      Mental Status: He is alert and oriented to person, place, and time.  Psychiatric:        Behavior: Behavior normal.      ED Treatments / Results  Labs (all labs ordered are listed, but only abnormal results are displayed) Labs Reviewed  CBC WITH DIFFERENTIAL/PLATELET - Abnormal; Notable for the following components:      Result Value   WBC 26.5 (*)    Hemoglobin 10.4 (*)    HCT 38.3 (*)    MCV 76.3 (*)    MCH 20.7 (*)    MCHC 27.2 (*)    RDW 21.9 (*)    nRBC 2.3 (*)    Neutro Abs 24.5 (*)    Lymphs Abs 0.1 (*)    Monocytes Absolute 1.6 (*)    Abs Immature Granulocytes 0.27 (*)    All other components within normal limits  BASIC METABOLIC PANEL - Abnormal; Notable for the following components:   CO2 14 (*)    BUN 36 (*)    Creatinine, Ser 1.58 (*)    GFR calc non Af Amer 45 (*)    GFR calc Af Amer 52 (*)    Anion gap 19 (*)    All other components within normal limits  TROPONIN I - Abnormal; Notable for the following components:   Troponin I 0.09 (*)    All other components within normal limits  BRAIN NATRIURETIC PEPTIDE - Abnormal; Notable for the following components:   B Natriuretic Peptide 1,859.8 (*)    All other components within normal limits  HEPATIC FUNCTION PANEL - Abnormal; Notable for the following components:   Total Protein 6.4 (*)    Albumin 2.9 (*)    AST 53 (*)    Total Bilirubin 2.2 (*)    Bilirubin, Direct 0.7 (*)    Indirect Bilirubin 1.5 (*)    All other components within normal limits  SARS CORONAVIRUS 2 (HOSPITAL ORDER, PERFORMED IN [facility] LAB)    EKG EKG Interpretation  Date/Time:  Friday Dec 12 2018 16:23:17 EDT Ventricular Rate:  122 PR Interval:    QRS Duration: 106 QT Interval:  314 QTC Calculation: 448 R Axis:   158 Text Interpretation:  Sinus tachycardia Probable left atrial enlargement Probable right ventricular hypertrophy Since last tracing rate faster Otherwise no significant change Confirmed by Melene Plan  [phone]) on 01/02/2019 4:36:33 PM   Radiology Dg Chest Port 1 View  Result Date: 12/25/2018 CLINICAL DATA:  Shortness of breath. EXAM: PORTABLE CHEST 1 VIEW COMPARISON:  01/14/2005 FINDINGS: There is a moderate right effusion and a small left effusion. There is bilateral interstitial pulmonary edema. Cardiomegaly.  Pulmonary vascularity is slightly prominent. No acute bone abnormality. Median sternotomy. IMPRESSION: 1. Bilateral pulmonary edema with bilateral pleural effusions, right greater than left. 2. Cardiomegaly with mild pulmonary vascular congestion. Findings are consistent with congestive heart failure. Electronically Signed   By: Francene BoyersJames  Maxwell M.D.   On: 10/15/18 16:47    Procedures Procedures (including critical care time)  Medications Ordered in ED Medications  Ipratropium-Albuterol (COMBIVENT) respimat 1 puff (1 puff Inhalation Given 12/29/2018 1802)  aerochamber plus with mask device 1 each (1 each Other Not Given 01/09/2019 1714)  albuterol (VENTOLIN HFA) 108 (90 Base) MCG/ACT inhaler (  Not Given 01/08/2019 1713)  albuterol (VENTOLIN HFA) 108 (90 Base) MCG/ACT inhaler 8 puff (8 puffs Inhalation Given 12/14/2018 1634)  methylPREDNISolone sodium succinate (SOLU-MEDROL) 125 mg/2 mL injection 125 mg (125 mg Intravenous Given 01/07/2019 1643)  magnesium sulfate IVPB 2 g 50 mL (0 g Intravenous Stopped 12/19/2018 1806)  terbutaline (BRETHINE) injection 0.25 mg (0.25 mg Subcutaneous Given 12/25/2018 1646)  furosemide (LASIX) injection 20 mg (20 mg Intravenous Given 12/17/2018 1701)  AeroChamber Plus Flo-Vu Large MISC (  Given 12/24/2018 1714)     Initial Impression / Assessment and Plan / ED Course  I have reviewed the triage vital signs and the nursing notes.  Pertinent labs & imaging results that were available during my care of the patient were reviewed by me and considered in my medical decision making (see chart for details).        68 yo M with a chief complaint of shortness of breath.  This been going on  for 6 months but worsening over the past few days.  Patient is significantly in distress, if this was not during the coronavirus pandemic then he would be immediately placed on BiPAP.    Improved significantly with inhalers and steroids and mag, though still with significant work of breathing. Covid negative, place on bipap.  Admit.   CRITICAL CARE Performed by: Rae Roamaniel Patrick Tyner Codner   Total critical care time: 80 minutes  Critical care time was exclusive of separately billable procedures and treating other patients.  Critical care was necessary to treat or prevent imminent or life-threatening deterioration.  Critical care was time spent personally by me on the following activities: development of treatment plan with patient and/or surrogate as well as nursing, discussions with consultants, evaluation of patient's response to treatment, examination of patient, obtaining history from patient or surrogate, ordering and performing treatments and interventions, ordering and review of laboratory studies, ordering and review of radiographic studies, pulse oximetry and re-evaluation of patient's condition.   The patients results and plan were reviewed and discussed.   Any x-rays performed were independently reviewed by myself.   Differential diagnosis were considered with the presenting HPI.  Medications  Ipratropium-Albuterol (COMBIVENT) respimat 1 puff (1 puff Inhalation Given 12/24/2018 1802)  aerochamber plus with mask device 1 each (1 each Other Not Given 12/25/2018 1714)  albuterol (VENTOLIN HFA) 108 (90 Base) MCG/ACT inhaler (  Not Given 01/10/2019 1713)  albuterol (VENTOLIN HFA) 108 (90 Base) MCG/ACT inhaler 8 puff (8 puffs Inhalation Given 01/01/2019 1634)  methylPREDNISolone sodium succinate (SOLU-MEDROL) 125 mg/2 mL injection 125 mg (125 mg Intravenous Given 12/30/2018 1643)  magnesium sulfate IVPB 2 g 50 mL (0 g Intravenous Stopped 12/17/2018 1806)  terbutaline (BRETHINE) injection 0.25 mg (0.25 mg  Subcutaneous Given 12/22/2018 1646)  furosemide (LASIX) injection 20 mg (20 mg Intravenous Given 01/09/2019 1701)  AeroChamber Plus Flo-Vu Large MISC (  Given 01/06/2019 1714)  Vitals:   12/27/2018 1634 12-27-18 1645 2018/12/27 1803 27-Dec-2018 1806  BP: (!) 133/117 111/66 99/79   Pulse: (!) 120  (!) 115   Resp: (!) 35 (!) 22 (!) 38 (!) 29  Temp:    98.1 F (36.7 C)  TempSrc:    Oral  SpO2:   100% 100%    Final diagnoses:  COPD exacerbation (HCC)  Acute on chronic systolic congestive heart failure (HCC)    Admission/ observation were discussed with the admitting physician, patient and/or family and they are comfortable with the plan.   Final Clinical Impressions(s) / ED Diagnoses   Final diagnoses:  COPD exacerbation (HCC)  Acute on chronic systolic congestive heart failure [facility])    ED Discharge Orders    None       Melene Plan, DO Dec 27, 2018 [phone]

## 2018-12-12 NOTE — ED Notes (Signed)
ED TO INPATIENT HANDOFF REPORT  ED Nurse Name and Phone #: Koleen Nimrod [phone]  S Name/Age/Gender Daniel Price 68 y.o. male Room/Bed: 032C/032C  Code Status   Code Status: Full Code  Home/SNF/Other Home Patient oriented to: self, place, time and situation Is this baseline? Yes   Triage Complete: Triage complete  Chief Complaint Difficulty Breathing  Triage Note Pt BIB [facility] from home complaining of shortness of breath x3 days that got worse this afternoon. Pt has history of emphysema. EMS also noted significant swelling in pt lower legs and wheezing in all lung fields.    Allergies No Known Allergies  Level of Care/Admitting Diagnosis ED Disposition    ED Disposition Condition Comment   Admit  Hospital Area: [facility] [[id number]]  Level of Care: Telemetry Cardiac [103]  Covid Evaluation: N/A  Diagnosis: Acute exacerbation of CHF (congestive heart failure) [facility]) [[id number]]  Admitting Physician: Nena Polio  Attending Physician: Nena Polio  Estimated length of stay: past midnight tomorrow  Certification:: I certify this patient will need inpatient services for at least 2 midnights  PT Class (Do Not Modify): Inpatient [101]  PT Acc Code (Do Not Modify): Private [1]       B Medical/Surgery History Past Medical History:  Diagnosis Date  . Anemia   . CHF (congestive heart failure) (HCC)   . Coronary artery disease   . History of SBE (subacute bacterial endocarditis)   . Hypercholesteremia   . Ischemic cardiomyopathy   . Myocardial infarction (HCC) 2006  . Polysubstance abuse (HCC)    Hx of  . Stroke (HCC)   . Transient ischemic attack, acute    Past Surgical History:  Procedure Laterality Date  . MITRAL VALVE REPLACEMENT       A IV Location/Drains/Wounds Patient Lines/Drains/Airways Status   Active Line/Drains/Airways    Name:   Placement date:   Placement time:   Site:   Days:   Peripheral IV 01/08/2019 Left  Antecubital   01/08/2019    1654    Antecubital   less than 1   Peripheral IV 12/17/2018 Right Antecubital   01/06/2019    1701    Antecubital   less than 1          Intake/Output Last 24 hours No intake or output data in the 24 hours ending 01/05/2019 2104  Labs/Imaging Results for orders placed or performed during the hospital encounter of 12/29/2018 (from the past 48 hour(s))  SARS Coronavirus 2 [facility] order, Performed in [facility] hospital lab)     Status: None   Collection Time: 12/29/2018  4:30 PM  Result Value Ref Range   SARS Coronavirus 2 NEGATIVE NEGATIVE    Comment: (NOTE) If result is NEGATIVE SARS-CoV-2 target nucleic acids are NOT DETECTED. The SARS-CoV-2 RNA is generally detectable in upper and lower  respiratory specimens during the acute phase of infection. The lowest  concentration of SARS-CoV-2 viral copies this assay can detect is 250  copies / mL. A negative result does not preclude SARS-CoV-2 infection  and should not be used as the sole basis for treatment or other  patient management decisions.  A negative result may occur with  improper specimen collection / handling, submission of specimen other  than nasopharyngeal swab, presence of viral mutation(s) within the  areas targeted by this assay, and inadequate number of viral copies  (<250 copies / mL). A negative result must be combined with clinical  observations, patient history, and  epidemiological information. If result is POSITIVE SARS-CoV-2 target nucleic acids are DETECTED. The SARS-CoV-2 RNA is generally detectable in upper and lower  respiratory specimens dur ing the acute phase of infection.  Positive  results are indicative of active infection with SARS-CoV-2.  Clinical  correlation with patient history and other diagnostic information is  necessary to determine patient infection status.  Positive results do  not rule out bacterial infection or co-infection with other viruses. If result is  PRESUMPTIVE POSTIVE SARS-CoV-2 nucleic acids MAY BE PRESENT.   A presumptive positive result was obtained on the submitted specimen  and confirmed on repeat testing.  While 2019 novel coronavirus  (SARS-CoV-2) nucleic acids may be present in the submitted sample  additional confirmatory testing may be necessary for epidemiological  and / or clinical management purposes  to differentiate between  SARS-CoV-2 and other Sarbecovirus currently known to infect humans.  If clinically indicated additional testing with an alternate test  methodology [phone]) is advised. The SARS-CoV-2 RNA is generally  detectable in upper and lower respiratory sp ecimens during the acute  phase of infection. The expected result is Negative. Fact Sheet for Patients:  BoilerBrush.com.cy Fact Sheet for Healthcare Providers: https://pope.com/ This test is not yet approved or cleared by the [place] FDA and has been authorized for detection and/or diagnosis of SARS-CoV-2 by FDA under an Emergency Use Authorization (EUA).  This EUA will remain in effect (meaning this test can be used) for the duration of the COVID-19 declaration under Section 564(b)(1) of the Act, 21 U.S.C. section 360bbb-3(b)(1), unless the authorization is terminated or revoked sooner. Performed at [facility], [address] [place], [place] [address]   CBC with Differential     Status: Abnormal   Collection Time: 12/13/2018  4:30 PM  Result Value Ref Range   WBC 26.5 (H) 4.0 - 10.5 K/uL    Comment: WHITE COUNT CONFIRMED ON SMEAR   RBC 5.02 4.22 - 5.81 MIL/uL   Hemoglobin 10.4 (L) 13.0 - 17.0 g/dL   HCT 81.1 (L) 91.4 - 78.2 %   MCV 76.3 (L) 80.0 - 100.0 fL   MCH 20.7 (L) 26.0 - 34.0 pg   MCHC 27.2 (L) 30.0 - 36.0 g/dL   RDW 95.6 (H) 21.3 - 08.6 %   Platelets 219 150 - 400 K/uL    Comment: REPEATED TO VERIFY   nRBC 2.3 (H) 0.0 - 0.2 %   Neutrophils Relative % 92 %   Neutro Abs  24.5 (H) 1.7 - 7.7 K/uL   Lymphocytes Relative 1 %   Lymphs Abs 0.1 (L) 0.7 - 4.0 K/uL   Monocytes Relative 6 %   Monocytes Absolute 1.6 (H) 0.1 - 1.0 K/uL   Eosinophils Relative 0 %   Eosinophils Absolute 0.0 0.0 - 0.5 K/uL   Basophils Relative 0 %   Basophils Absolute 0.0 0.0 - 0.1 K/uL   Immature Granulocytes 1 %   Abs Immature Granulocytes 0.27 (H) 0.00 - 0.07 K/uL    Comment: Performed at [facility], [address] [place], [place] [address]  Basic metabolic panel     Status: Abnormal   Collection Time: 12/26/2018  4:30 PM  Result Value Ref Range   Sodium 141 135 - 145 mmol/L   Potassium 4.6 3.5 - 5.1 mmol/L   Chloride 108 98 - 111 mmol/L   CO2 14 (L) 22 - 32 mmol/L   Glucose, Bld 99 70 - 99 mg/dL   BUN 36 (H)  8 - 23 mg/dL   Creatinine, Ser 1.611.58 (H) 0.61 - 1.24 mg/dL   Calcium 9.0 8.9 - 09.610.3 mg/dL   GFR calc non Af Amer 45 (L) >60 mL/min   GFR calc Af Amer 52 (L) >60 mL/min   Anion gap 19 (H) 5 - 15    Comment: Performed at [facility], [address] [place], [place] [address]  Troponin I - ONCE - STAT     Status: Abnormal   Collection Time: June 28, 2019  4:30 PM  Result Value Ref Range   Troponin I 0.09 (HH) <0.03 ng/mL    Comment: CRITICAL RESULT CALLED TO, READ BACK BY AND VERIFIED WITH: R HARDY,RN 1742 01/01/2019 D BRADLEY Performed at [facility], [address] [place], [place] [address]   Brain natriuretic peptide     Status: Abnormal   Collection Time: June 28, 2019  4:30 PM  Result Value Ref Range   B Natriuretic Peptide 1,859.8 (H) 0.0 - 100.0 pg/mL    Comment: Performed at [facility], [address] [place], [place] [address]  Hepatic function panel     Status: Abnormal   Collection Time: June 28, 2019  4:30 PM  Result Value Ref Range   Total Protein 6.4 (L) 6.5 - 8.1 g/dL   Albumin 2.9 (L) 3.5 - 5.0 g/dL   AST 53 (H) 15 - 41 U/L   ALT <5 0 - 44 U/L    Comment: REPEATED TO VERIFY   Alkaline Phosphatase 84 38 - 126 U/L   Total Bilirubin  2.2 (H) 0.3 - 1.2 mg/dL   Bilirubin, Direct 0.7 (H) 0.0 - 0.2 mg/dL   Indirect Bilirubin 1.5 (H) 0.3 - 0.9 mg/dL    Comment: Performed at [facility], [address] [place], [place] [address]  I-STAT 7, (LYTES, BLD GAS, ICA, H+H)     Status: Abnormal   Collection Time: June 28, 2019  8:03 PM  Result Value Ref Range   pH, Arterial 7.472 (H) 7.350 - 7.450   pCO2 arterial 23.6 (L) 32.0 - 48.0 mmHg   pO2, Arterial 92.0 83.0 - 108.0 mmHg   Bicarbonate 17.3 (L) 20.0 - 28.0 mmol/L   TCO2 18 (L) 22 - 32 mmol/L   O2 Saturation 98.0 %   Acid-base deficit 5.0 (H) 0.0 - 2.0 mmol/L   Sodium 138 135 - 145 mmol/L   Potassium 3.8 3.5 - 5.1 mmol/L   Calcium, Ion 1.22 1.15 - 1.40 mmol/L   HCT 29.0 (L) 39.0 - 52.0 %   Hemoglobin 9.9 (L) 13.0 - 17.0 g/dL   Patient temperature 30.898.1 F    Collection site RADIAL, ALLEN'S TEST ACCEPTABLE    Drawn by RT    Sample type ARTERIAL    Dg Chest Port 1 View  Result Date: 12/29/2018 CLINICAL DATA:  Shortness of breath. EXAM: PORTABLE CHEST 1 VIEW COMPARISON:  01/14/2005 FINDINGS: There is a moderate right effusion and a small left effusion. There is bilateral interstitial pulmonary edema. Cardiomegaly. Pulmonary vascularity is slightly prominent. No acute bone abnormality. Median sternotomy. IMPRESSION: 1. Bilateral pulmonary edema with bilateral pleural effusions, right greater than left. 2. Cardiomegaly with mild pulmonary vascular congestion. Findings are consistent with congestive heart failure. Electronically Signed   By: Francene BoyersJames  Maxwell M.D.   On: November 15, 202020 16:47    Pending Labs Unresulted Labs (From admission, onward)    Start     Ordered   12/13/18 0500  Basic metabolic panel  Tomorrow morning,   R  01/01/2019 2039   12/13/18 0500  CBC  Tomorrow morning,   R     01/10/2019 2039   12/22/2018 2035  HIV antibody (Routine Testing)  Once,   R     12/25/2018 2039   01/08/2019 1927  Blood gas, arterial  Once,   R     12/23/2018 1926           Vitals/Pain Today's Vitals   12/16/2018 1830 01/11/2019 1906 12/25/2018 1957 01/08/2019 1959  BP: 105/78 92/67  91/77  Pulse:    71  Resp: 17   (!) 27  Temp:      TempSrc:      SpO2:    (!) 75%  PainSc:   0-No pain     Isolation Precautions Droplet and Contact precautions  Medications Medications  Ipratropium-Albuterol (COMBIVENT) respimat 1 puff (1 puff Inhalation Given 12/22/2018 1802)  aerochamber plus with mask device 1 each (1 each Other Not Given 01/05/2019 1714)  albuterol (VENTOLIN HFA) 108 (90 Base) MCG/ACT inhaler (  Not Given 12/26/2018 1713)  heparin injection 5,000 Units (has no administration in time range)  acetaminophen (TYLENOL) tablet 650 mg (has no administration in time range)    Or  acetaminophen (TYLENOL) suppository 650 mg (has no administration in time range)  senna-docusate (Senokot-S) tablet 1 tablet (has no administration in time range)  promethazine (PHENERGAN) tablet 12.5 mg (has no administration in time range)  albuterol (VENTOLIN HFA) 108 (90 Base) MCG/ACT inhaler 8 puff (8 puffs Inhalation Given 12/21/2018 1634)  methylPREDNISolone sodium succinate (SOLU-MEDROL) 125 mg/2 mL injection 125 mg (125 mg Intravenous Given 12/13/2018 1643)  magnesium sulfate IVPB 2 g 50 mL (0 g Intravenous Stopped 01/05/2019 1806)  terbutaline (BRETHINE) injection 0.25 mg (0.25 mg Subcutaneous Given 12/17/2018 1646)  furosemide (LASIX) injection 20 mg (20 mg Intravenous Given 01/10/2019 1701)  AeroChamber Plus Flo-Vu Large MISC (  Given 01/03/2019 1714)    Mobility walks Low fall risk   Focused Assessments Pulmonary Assessment Handoff:  Lung sounds: Bilateral Breath Sounds: Clear, Diminished L Breath Sounds: Expiratory wheezes R Breath Sounds: Expiratory wheezes O2 Device: Bi-PAP O2 Flow Rate (L/min): 15 L/min      R Recommendations: See Admitting Provider Note  Report given to:   Additional Notes: Pt a&ox 4; denies pain

## 2018-12-12 NOTE — ED Triage Notes (Signed)
Pt BIB [facility] from home complaining of shortness of breath x3 days that got worse this afternoon. Pt has history of emphysema. EMS also noted significant swelling in pt lower legs and wheezing in all lung fields.

## 2018-12-12 NOTE — H&P (Addendum)
Date: 12/27/2018               Patient Name:  Daniel BlueDonald G Sayer MRN: [id number]  DOB: 10/10/1950 Age / Sex: 68 y.o., male   [facility]         Medical Service: Internal Medicine Teaching Service         Attending Physician: Dr. Inez CatalinaMullen, Emily B, MD    First Contact: Dr. Thornell MuleBrandon Winfrey Pager: [phone]            After Hours (After 5p/  First Contact Pager: [phone]  weekends / holidays): Second Contact Pager: [phone]   Chief Complaint: Shortness of breath  History of Present Illness: Mr. Daniel Price is a 68 year old male with copd, mitral valve issues, hx of MI, CHF, hx of stroke and TIA, HTN who presents with shortness of breath.  He states that over the last 6 months he has had shortness of breath while resting and with movement. He states that over the last 2-3 days this shortness of breath has worsened and he has had associated lower extremity edema and lightheadedness. He also reports a significant amount of weight gain (normal weight 175-180 lbs). He became very concerned this morning and called his sister who instructed him to call EMS. He denies chest pain, palpitations, cough, congestion, changes in appetite, and fevers. He denies abdominal pain, nausea, and vomiting.    He does have a history of CHF which was diagnosed 10 years ago. He takes CV medications but is unsure which ones he takes. [facility] ([facility] reportedly 1 month ago). He does report taking them everyday and denies missing any doses.   In the ED he was found to be tachypneic with respiratory rate in the 20s to 30s, O2 sats in the 70s (placed on Bipap), tachycardic with heart rates in the 110s-120s, blood pressure initially soft with systolics into the 90s but later improved with SBP in the 130s-150s.  Labs significant for leukocytosis of 26.5, microcytic anemia with hemoglobin of 10.4, evaded creatinine of 1.58 (last creatinine from 2013 0.81), elevated anion gap of  19.  Elevated troponin of 0.09.  BNP 1859.  COVID negative. He was given albuterol, Combivent, Solu-Medrol, terbutaline, and Lasix 20 mg IV. COVID19 testing negative.   Meds:  Current Meds  Medication Sig  . Cholecalciferol (VITAMIN D3) 50 MCG (2000 UT) TABS Take 2,000 Units by mouth 2 (two) times a day.  . clopidogrel (PLAVIX) 75 MG tablet Take 75 mg by mouth every evening.  . galantamine (RAZADYNE) 8 MG tablet Take 8 mg by mouth daily.  Marland Kitchen. lisinopril (ZESTRIL) 20 MG tablet Take 10 mg by mouth daily.  . metoprolol tartrate (LOPRESSOR) 100 MG tablet Take 100 mg by mouth daily.  . Omega-3 Fatty Acids (FISH OIL PO) Take 1 capsule by mouth daily.  . pantoprazole (PROTONIX) 40 MG tablet Take 40 mg by mouth every evening.   . simvastatin (ZOCOR) 80 MG tablet Take 80 mg by mouth every evening.     Allergies: Allergies as of 2019/01/11  . (No Known Allergies)   Past Medical History:  Diagnosis Date  . Anemia   . CHF (congestive heart failure) (HCC)   . Coronary artery disease   . History of SBE (subacute bacterial endocarditis)   . Hypercholesteremia   . Ischemic cardiomyopathy   . Myocardial infarction (HCC) 2006  . Polysubstance abuse (HCC)    Hx of  .  Stroke (HCC)   . Transient ischemic attack, acute     Family History:  No significant family history.  Social History: He smokes tobacco-- 1ppd. Started 25 years ago. He denies alcohol use. No etoh use. He has remote drug use-- 20 yrs ago used cocaine.  He lives alone in [place], retired used to be a Control and instrumentation engineer    Review of Systems: A complete ROS was negative except as per HPI.   Physical Exam: Blood pressure 92/67, pulse (!) 109, temperature 98.1 F (36.7 C), temperature source Oral, resp. rate 17, SpO2 96 %. General: Lying in bed in no acute distress  HEENT: Normocephalic, atraumatic, EOMI CV: Tachycardic, normal rhythm, JVD present Resp: diminished breath sounds bilaterally, normal WOB, normal oxygen sats on bipap, he  does desat when lying him down.  Abd: Normoactive BSs, non-tender to palpation, no masses appreciated MSK: Severe 3+ pitting edema of the LE bilaterally, Cool upper and lower extremities Psych: Normal behavior, mood, and affect Skin: Cool upper and lower extremities bilaterally, dry  EKG: personally reviewed my interpretation is sinus tachycardia, right ventricular hypertrophy  CXR: personally reviewed my interpretation is bilateral pleural effusions (right>>>left), cardiomegaly with mild pulmonary vascular congestion.  Assessment & Plan by Problem: Active Problems:   Acute exacerbation of CHF (congestive heart failure) [facility])   Mr. Daniel Price is a 68 year old male with CHF, COPD, and HTN who presents with SOB, LE edema, and found to have an elevated BNP and bilateral pleural effusions consistent with acute on chronic heart failure exacerbation and potential cardiogenic shock (cool extremities and hypotension). Symptoms less likely to be due to COPD exacerbation (wheezing on ED exam, no cough or sputum production). ACS should be ruled out as a cause of his acute decompensation of heart failure. He does elevated troponin of 0.09 but no signs of acute ischemic changes on EKG and no chest pain.   Acute on chronic heart failure exacerbation: s/p Lasix 20 mg IV. - Give an additional 80 mg IV Lasix now - Follow-up cardiology recommendations. - ECHO - Daily weights, strict I/O - Trend troponin's - Continuous cardiac monitoring - Continue supplemental oxygen as needed  Acute respiratory failure: Likely due to pulmonary edema in the setting of decompensated heart failure.  - Continue supplemental oxygen as above   Acute vs Chronic Kidney Injury: Creatinine elevated to 1.58. Last creatinine from 2013 WNL. Maybe a chronic process from medical renal disease vs an acute change 2/2 cardiorenal syndrome. - Lasix as above - Continue to monitor  Leukocytosis: WBC 26.5. Unclear etiology. It does not appear  that he received steroids prior to blood work. COVID negative. No signs of infection including cough, congestion, fever, urinary symptoms, changes in bowel movements, or abdominal pain. - Continue to monitor with daily CBCs  COPD: - Continue supplemental oxygen as meeded - Continue Combivent 1 puff q6h   Hypertension: Holding home lisinopril and metoprolol in the setting of acute decompensated heart failure.  Hx of TIA/Stroke: - Continue atorvastatin 40 mg daily and Plavix 75 mg daily  FEN/GI: Heart healthy DVT ppx: heparin CODE STATUS: Full code  Dispo: Admit patient to Inpatient with expected length of stay greater than 2 midnights.  Signed: Synetta Shadow, MD 01/05/2019, 7:19 PM  Pager: [phone]

## 2018-12-12 NOTE — ED Notes (Signed)
Nurse Navigator Note   Pt's sister, Gavin Pound, called for update. I spoke with her while at the patients bedside about plan of care and patients condition at this time. BiPAP had just been placed on the patient, he was able to speak with her briefly over speaker phone. Gave call back number to the hospital for her to call back if she has any further questions. She can be reached at [phone] if any further issues arise.

## 2018-12-13 ENCOUNTER — Inpatient Hospital Stay (HOSPITAL_COMMUNITY): Payer: Medicare Other

## 2018-12-13 ENCOUNTER — Inpatient Hospital Stay: Payer: Self-pay

## 2018-12-13 DIAGNOSIS — J449 Chronic obstructive pulmonary disease, unspecified: Secondary | ICD-10-CM

## 2018-12-13 DIAGNOSIS — D72829 Elevated white blood cell count, unspecified: Secondary | ICD-10-CM

## 2018-12-13 DIAGNOSIS — I371 Nonrheumatic pulmonary valve insufficiency: Secondary | ICD-10-CM

## 2018-12-13 DIAGNOSIS — Z952 Presence of prosthetic heart valve: Secondary | ICD-10-CM

## 2018-12-13 DIAGNOSIS — J810 Acute pulmonary edema: Secondary | ICD-10-CM

## 2018-12-13 DIAGNOSIS — R011 Cardiac murmur, unspecified: Secondary | ICD-10-CM

## 2018-12-13 DIAGNOSIS — Z720 Tobacco use: Secondary | ICD-10-CM

## 2018-12-13 DIAGNOSIS — Z8673 Personal history of transient ischemic attack (TIA), and cerebral infarction without residual deficits: Secondary | ICD-10-CM

## 2018-12-13 DIAGNOSIS — I252 Old myocardial infarction: Secondary | ICD-10-CM

## 2018-12-13 DIAGNOSIS — I509 Heart failure, unspecified: Secondary | ICD-10-CM

## 2018-12-13 DIAGNOSIS — L8990 Pressure ulcer of unspecified site, unspecified stage: Secondary | ICD-10-CM | POA: Diagnosis present

## 2018-12-13 DIAGNOSIS — Z7902 Long term (current) use of antithrombotics/antiplatelets: Secondary | ICD-10-CM

## 2018-12-13 DIAGNOSIS — Z7951 Long term (current) use of inhaled steroids: Secondary | ICD-10-CM

## 2018-12-13 DIAGNOSIS — I361 Nonrheumatic tricuspid (valve) insufficiency: Secondary | ICD-10-CM

## 2018-12-13 DIAGNOSIS — I5082 Biventricular heart failure: Secondary | ICD-10-CM

## 2018-12-13 DIAGNOSIS — Z79899 Other long term (current) drug therapy: Secondary | ICD-10-CM

## 2018-12-13 DIAGNOSIS — I110 Hypertensive heart disease with heart failure: Secondary | ICD-10-CM

## 2018-12-13 LAB — COOXEMETRY PANEL
Carboxyhemoglobin: 1.1 % (ref 0.5–1.5)
Carboxyhemoglobin: 1.5 % (ref 0.5–1.5)
Carboxyhemoglobin: 1.5 % (ref 0.5–1.5)
Methemoglobin: 1.6 % — ABNORMAL HIGH (ref 0.0–1.5)
Methemoglobin: 1.1 % (ref 0.0–1.5)
Methemoglobin: 1.8 % — ABNORMAL HIGH (ref 0.0–1.5)
O2 Saturation: 28.9 %
O2 Saturation: 41.2 %
O2 Saturation: 48.8 %
Total hemoglobin: 9.5 g/dL — ABNORMAL LOW (ref 12.0–16.0)
Total hemoglobin: 9.5 g/dL — ABNORMAL LOW (ref 12.0–16.0)
Total hemoglobin: 9.5 g/dL — ABNORMAL LOW (ref 12.0–16.0)

## 2018-12-13 LAB — CBC
HCT: 33.1 % — ABNORMAL LOW (ref 39.0–52.0)
Hemoglobin: 9.3 g/dL — ABNORMAL LOW (ref 13.0–17.0)
MCH: 20.2 pg — ABNORMAL LOW (ref 26.0–34.0)
MCHC: 28.1 g/dL — ABNORMAL LOW (ref 30.0–36.0)
MCV: 71.8 fL — ABNORMAL LOW (ref 80.0–100.0)
Platelets: 155 10*3/uL (ref 150–400)
RBC: 4.61 MIL/uL (ref 4.22–5.81)
RDW: 21.2 % — ABNORMAL HIGH (ref 11.5–15.5)
WBC: 21.8 10*3/uL — ABNORMAL HIGH (ref 4.0–10.5)
nRBC: 1 % — ABNORMAL HIGH (ref 0.0–0.2)

## 2018-12-13 LAB — BASIC METABOLIC PANEL
Anion gap: 13 (ref 5–15)
BUN: 38 mg/dL — ABNORMAL HIGH (ref 8–23)
CO2: 18 mmol/L — ABNORMAL LOW (ref 22–32)
Calcium: 8.8 mg/dL — ABNORMAL LOW (ref 8.9–10.3)
Chloride: 107 mmol/L (ref 98–111)
Creatinine, Ser: 1.47 mg/dL — ABNORMAL HIGH (ref 0.61–1.24)
GFR calc Af Amer: 57 mL/min — ABNORMAL LOW (ref >60)
GFR calc non Af Amer: 49 mL/min — ABNORMAL LOW (ref >60)
Glucose, Bld: 164 mg/dL — ABNORMAL HIGH (ref 70–99)
Potassium: 3.9 mmol/L (ref 3.5–5.1)
Sodium: 138 mmol/L (ref 135–145)

## 2018-12-13 LAB — TROPONIN I
Troponin I: 0.09 ng/mL (ref <0.03)
Troponin I: 0.08 ng/mL (ref <0.03)

## 2018-12-13 LAB — POCT I-STAT 7, (LYTES, BLD GAS, ICA,H+H)
Acid-base deficit: 6 mmol/L — ABNORMAL HIGH (ref 0.0–2.0)
Bicarbonate: 17.8 mmol/L — ABNORMAL LOW (ref 20.0–28.0)
Calcium, Ion: 1.28 mmol/L (ref 1.15–1.40)
HCT: 32 % — ABNORMAL LOW (ref 39.0–52.0)
Hemoglobin: 10.9 g/dL — ABNORMAL LOW (ref 13.0–17.0)
O2 Saturation: 100 %
Potassium: 4.1 mmol/L (ref 3.5–5.1)
Sodium: 138 mmol/L (ref 135–145)
TCO2: 19 mmol/L — ABNORMAL LOW (ref 22–32)
pCO2 arterial: 30.1 mmHg — ABNORMAL LOW (ref 32.0–48.0)
pH, Arterial: 7.379 (ref 7.350–7.450)
pO2, Arterial: 305 mmHg — ABNORMAL HIGH (ref 83.0–108.0)

## 2018-12-13 LAB — POCT ACTIVATED CLOTTING TIME: Activated Clotting Time: 0 seconds

## 2018-12-13 LAB — ECHOCARDIOGRAM LIMITED
Height: 74 in
Weight: 2927.71 oz

## 2018-12-13 LAB — MAGNESIUM: Magnesium: 2.3 mg/dL (ref 1.7–2.4)

## 2018-12-13 LAB — HIV ANTIBODY (ROUTINE TESTING W REFLEX): HIV Screen 4th Generation wRfx: NONREACTIVE

## 2018-12-13 MED ORDER — MIDAZOLAM HCL 2 MG/2ML IJ SOLN
INTRAMUSCULAR | Status: AC
  Filled 2018-12-13: qty 2

## 2018-12-13 MED ORDER — MIDODRINE HCL 5 MG PO TABS: 5.0000 mg | ORAL_TABLET | Freq: Three times a day (TID) | ORAL | Status: DC

## 2018-12-13 MED ORDER — NOREPINEPHRINE 4 MG/250ML-% IV SOLN
0.0000 ug/min | INTRAVENOUS | Status: DC
  Administered 2018-12-13 – 2018-12-14 (×4): 10 ug/min via INTRAVENOUS
  Filled 2018-12-13 (×3): qty 250

## 2018-12-13 MED ORDER — FUROSEMIDE 10 MG/ML IJ SOLN
INTRAMUSCULAR | Status: AC
  Administered 2018-12-13: 15:00:00
  Filled 2018-12-13: qty 8

## 2018-12-13 MED ORDER — IPRATROPIUM-ALBUTEROL 20-100 MCG/ACT IN AERS
1.0000 | INHALATION_SPRAY | Freq: Four times a day (QID) | RESPIRATORY_TRACT | Status: DC | PRN
  Filled 2018-12-13: qty 4

## 2018-12-13 MED ORDER — MILRINONE LACTATE IN DEXTROSE 20-5 MG/100ML-% IV SOLN
0.5000 ug/kg/min | INTRAVENOUS | Status: DC
  Administered 2018-12-13 – 2018-12-14 (×2): 0.25 ug/kg/min via INTRAVENOUS
  Administered 2018-12-14 – 2018-12-15 (×3): 0.5 ug/kg/min via INTRAVENOUS
  Filled 2018-12-13 (×6): qty 100

## 2018-12-13 MED ORDER — FUROSEMIDE 10 MG/ML IJ SOLN: 60.0000 mg | Freq: Once | INTRAMUSCULAR | Status: DC

## 2018-12-13 MED ORDER — NOREPINEPHRINE 4 MG/250ML-% IV SOLN
0.0000 ug/min | INTRAVENOUS | Status: DC
  Filled 2018-12-13: qty 250

## 2018-12-13 MED ORDER — MILRINONE LACTATE IN DEXTROSE 20-5 MG/100ML-% IV SOLN: 0.2500 ug/kg/min | INTRAVENOUS | Status: DC

## 2018-12-13 MED ORDER — FUROSEMIDE 10 MG/ML IJ SOLN
80.0000 mg | Freq: Once | INTRAMUSCULAR | Status: AC
  Administered 2018-12-13: 80 mg via INTRAVENOUS
  Filled 2018-12-13: qty 8

## 2018-12-13 MED ORDER — FUROSEMIDE 10 MG/ML IJ SOLN
20.0000 mg/h | INTRAVENOUS | Status: DC
  Administered 2018-12-13 – 2018-12-14 (×4): 20 mg/h via INTRAVENOUS
  Filled 2018-12-13: qty 21
  Filled 2018-12-13 (×2): qty 25

## 2018-12-13 MED ORDER — VANCOMYCIN HCL IN DEXTROSE 1-5 GM/200ML-% IV SOLN
1000.0000 mg | Freq: Once | INTRAVENOUS | Status: AC
  Administered 2018-12-13: 1000 mg via INTRAVENOUS
  Filled 2018-12-13: qty 200

## 2018-12-13 MED ORDER — FUROSEMIDE 10 MG/ML IJ SOLN
120.0000 mg | Freq: Once | INTRAVENOUS | Status: DC
  Filled 2018-12-13: qty 12

## 2018-12-13 MED ORDER — FENTANYL CITRATE (PF) 100 MCG/2ML IJ SOLN
INTRAMUSCULAR | Status: AC
  Filled 2018-12-13: qty 2

## 2018-12-13 NOTE — CV Procedure (Signed)
Radial arterial line placement.    Emergent consent assumed. The right wrist was prepped and draped in the routine sterile fashion. We made multiple attempts to cannulate the right radial artery with u/s and Doppler guidance but were unsuccessful. The left wrist was then prepped and using u/s guidance a single lumen radial arterial catheter was placed in the left radial artery using a modified Seldinger technique. Good blood flow and wave forms. A dressing was placed.     Arvilla Meres, MD  3:59 PM

## 2018-12-13 NOTE — Progress Notes (Signed)
Orthopedic Tech Progress Note Patient Details:  Daniel Price 04-30-1951 [id number]  Ortho Devices Type of Ortho Device: Ace wrap, Unna boot Ortho Device/Splint Location: Bilateral unna boots Ortho Device/Splint Interventions: Application   Post Interventions Patient Tolerated: Well Instructions Provided: Care of device   [name] 12/13/2018, 1:27 PM

## 2018-12-13 NOTE — Progress Notes (Addendum)
Noted that the patient's blood pressures is continuing to drop with systolic blood pressures ranging 70-80s and diastolic blood pressure ranging 60-70s on both automatic and manual blood pressures. Low pulse pressures, MAP of 67, and cool extremities on admission caused concern for cardiogenic shock.   Assessment and plan  Called cardiology consult and they stated that patient does not need inotrope support at this time but will need to be monitored closely.   Went to bedside to examine patient. Patient has only put out - since starting lasix 20mg . He has jvp to 8cm and 3+ pitting edema in bilateral lower extremities. He is mentating well. Labs do not show significant end organ damage.  -Added IV Lasix 80mg  once  -Monitor I/Os and daily weights -Maintain SBP>38mmhg and MAP>65, q 1hr vital signs -TTE in the morning with close examination as to whether patient has a bioprosthetic mitral valve. If patient does have a prosthetic valve then we will need to add full dose anticoagulation.  -Will need to get cardiology records from [place] and speak to sister tomorrow 5/3    Lorenso Courier, MD Internal Medicine PGY2 Pager:[phone] 12/13/2018, 1:19 AM

## 2018-12-13 NOTE — Progress Notes (Signed)
This RT attempted x2 and Charge RT, Tim attempted x2 for aline. Unable to get catheter to thread. RN made aware. Pt asking to be taken off BIPAP at this time. Placed on 5L [place].

## 2018-12-13 NOTE — Progress Notes (Signed)
Pt is transferred to 2C06. Report given to [place], [place]

## 2018-12-13 NOTE — Progress Notes (Addendum)
   Subjective: Pt denies symptoms other than LE edema despite being in respiratory distress  Objective:  Vital signs in last 24 hours: Vitals:   12/13/18 0658 12/13/18 0734 12/13/18 0828 12/13/18 0903  BP:  96/74 (!) 82/53 (!) 87/56  Pulse:   (!) 110   Resp:  (!) 24 18 (!) 24  Temp: 98 F (36.7 C)  97.7 F (36.5 C)   TempSrc: Oral  Oral   SpO2: 98% 96% 95%   Weight:      Height:       Cardiac: tachycardic with regular rhythm, heart sounds muffled, 2/6 TR murmur no rubs or gallops Pulmonary: increased work of breathing, tachypneic, distant breath sounds at bases, no wheezing Extremities: 3+ LE edema bilaterally Psych: Alert, conversant    Assessment/Plan:  Active Problems:   Acute exacerbation of CHF (congestive heart failure) (HCC)   Pressure injury of skin  Acute CHF/BIV failure/Cardiogenic shock: pt seems to feel this was gradual, uncertain mitral valve function now and prior.  RV barely moving on ECHO this morning severely dilated RV and RA, LA dilated as well, moderate effusion, MAP's still low, very poor urine output despite escalation of lasix in a lasix naive patient.   -PICC line ordered -cardiology re-evaluated this morning and will involve AHF -limit bipap with predominant RHF -may require inotropic and or vasopressor support which will facilitate diuresis and improve position on starling curve  Leukocytosis: likely stress reaction secondary to shock  -no infectious source identified, white count actually decreased after steroids yesterday  COPD: - Continue supplemental oxygen as meeded - Continue Combivent 1 puff q6h   Hypertension: Holding home lisinopril and metoprolol in the setting of acute decompensated heart failure.  Hx of TIA/Stroke: - Continue atorvastatin 40 mg daily and Plavix 75 mg daily  Dispo: Anticipated discharge pending clinical course.   Daniel Ingles, MD 12/13/2018, 10:33 AM

## 2018-12-13 NOTE — Progress Notes (Signed)
  Date: 12/13/2018  Patient name: Daniel Price  Medical record number: [id number]  Date of birth: August 21, 1950   I have seen and evaluated this patient and I have discussed the plan of care with the house staff. Please see Dr. Starr Sinclair note for complete details. I concur with his findings with the following additions/corrections: After our team saw the patient, he acutely decompensated and was transferred to the [facility] ICU for further care.    Inez Catalina, MD 12/13/2018, 7:21 PM

## 2018-12-13 NOTE — CV Procedure (Signed)
  Re-placement of LIJ triple lumen catheter.  Patient dislodged majority of left IJ cath that was just placed but was still in vessel. Position confirmed by CXR and ability to aspirate blood from all 3 ports of the catheter.   Consent obtained. Time out performed.   I cleaned the entire left neck and IJ catheter sterilely. I then rewired the catheter and exchanged the catheter for a new triple lumen catheter under complete sterile technique. I then sutured the catheter in placed and dressed with Biopatch and sterile dressing. Good blood flow via all 3 ports. F/u CXR pending.   Given traumatic dislodgement with give 1g vancomycin.   Arvilla Meres, MD  5:44 PM

## 2018-12-13 NOTE — CV Procedure (Signed)
Central Venous Catheter Insertion Procedure Note Daniel Price [id number] [birth year]    Procedure: Insertion of Central Venous Catheter Indications: Drug and/or fluid administration   Procedure Details Consent: Risks of procedure as well as the alternatives and risks of each were explained to the (patient/caregiver).  Consent for procedure obtained. Time Out: With bedside RN and House Coverage RN present. Verified patient identification, verified procedure, site/side was marked, verified correct patient position, special equipment/implants available, medications/allergies/relevent history reviewed, required imaging and test results available.  Performed   Maximum sterile technique was used including antiseptics, cap, gloves, gown, hand hygiene, mask and sheet. Skin prep: Chlorhexidine; local anesthetic administered A antimicrobial bonded/coated triple lumen catheter was placed in the left internal jugular vein using the Seldinger technique and u/s guidance.   Evaluation Blood flow good Complications: No apparent complications Patient did tolerate procedure well. Chest X-ray ordered to verify placement.  CXR: pending   Arvilla Meres, MD  1:26 PM

## 2018-12-13 NOTE — Progress Notes (Signed)
Pt noted to be off the cardiac monitor, RN entered room to find pt sitting up on side of bed with blood dripping down the back of his neck from central line area- Central line appearing to be pulled. Pt is visibly SOB - called for help, assisted pt to lay back in bed. Pressure held to Left IJ Central line site which appears to be pulled out of place. Rapid Response Team called and came to bedside, Dr. Gala Romney paged and came to bedside, pt given 80 mg of IV lasix STAT per verbal order of dr Gala Romney, orders to transfer to [facility] ICU.

## 2018-12-13 NOTE — Consult Note (Signed)
Cardiology Consultation:   Patient ID: Daniel Price MRN: [id number]; DOB: 09-18-1950  Admit date: 01/08/2019 Date of Consult: 12/13/2018  Primary Care Provider: [facility], [facility] Primary Cardiologist: Dr. Newt Lukes? At [facility] Primary Electrophysiologist:  None    Patient Profile:   Daniel Price is a 68 y.o. male with a hx of mitral valve replacement, COPD, HLD, CVA, and possible ICM who is being seen today for the evaluation of swelling at the request of the internal medicine team.  History of Present Illness:   Daniel Price states that he has noticed worsening swelling in his legs over the past few weeks that has been associated with shortness of breath. His shortness of breath worsened today prompting him to come to the ER. He denies any chest pain, palpitations, lightheadedness, dizziness, abdominal pain, diarrhea, constipation, nausea, vomiting or abdominal pain. He has been able to lie fairly flat in bed at home without difficulties. He states that he is on a number of medications at home but cannot recall there names. He had his mitral valve replaced a number of years ago, he thinks because it was leaky, but he does not know what type of valve. He believes he is on a blood thinner but is not sure what type. He states that he is feeling better compared to when he came to the hospital. He does not think he has had an echo in 6+ years.   Past Medical History:  Diagnosis Date  . Anemia   . CHF (congestive heart failure) (HCC)   . Coronary artery disease   . History of SBE (subacute bacterial endocarditis)   . Hypercholesteremia   . Ischemic cardiomyopathy   . Myocardial infarction (HCC) 2006  . Polysubstance abuse (HCC)    Hx of  . Stroke (HCC)   . Transient ischemic attack, acute     Past Surgical History:  Procedure Laterality Date  . MITRAL VALVE REPLACEMENT       Home Medications:  Prior to Admission medications   Medication Sig Start Date End Date Taking? Authorizing  Provider  Cholecalciferol (VITAMIN D3) 50 MCG (2000 UT) TABS Take 2,000 Units by mouth 2 (two) times a day.   Yes [provider]  clopidogrel (PLAVIX) 75 MG tablet Take 75 mg by mouth every evening.   Yes [provider]  galantamine (RAZADYNE) 8 MG tablet Take 8 mg by mouth daily.   Yes [provider]  lisinopril (ZESTRIL) 20 MG tablet Take 10 mg by mouth daily.   Yes [provider]  metoprolol tartrate (LOPRESSOR) 100 MG tablet Take 100 mg by mouth daily.   Yes [provider]  Omega-3 Fatty Acids (FISH OIL PO) Take 1 capsule by mouth daily.   Yes [provider]  pantoprazole (PROTONIX) 40 MG tablet Take 40 mg by mouth every evening.    Yes [provider]  simvastatin (ZOCOR) 80 MG tablet Take 80 mg by mouth every evening.   Yes [provider]    Inpatient Medications: Scheduled Meds: . aerochamber plus with mask  1 each Other Once  . albuterol      . atorvastatin  40 mg Oral q1800  . clopidogrel  75 mg Oral QPM  . furosemide  80 mg Intravenous Once  . heparin  5,000 Units Subcutaneous Q8H  . Ipratropium-Albuterol  1 puff Inhalation Q6H  . pantoprazole  40 mg Oral QPM   Continuous Infusions: . norepinephrine (LEVOPHED) Adult infusion     PRN Meds:  acetaminophen **OR** acetaminophen, promethazine, senna-docusate  Allergies:   No Known Allergies  Social History:   Social History   Socioeconomic History  . Marital status: Single    Spouse name: Not on file  . Number of children: Not on file  . Years of education: Not on file  . Highest education level: Not on file  Occupational History  . Not on file  Social Needs  . Financial resource strain: Not on file  . Food insecurity:    Worry: Not on file    Inability: Not on file  . Transportation needs:    Medical: Not on file    Non-medical: Not on file  Tobacco Use  . Smoking status: Current Every Day Smoker    Packs/day: 0.50    Types:  Cigarettes  . Smokeless tobacco: Never Used  Substance and Sexual Activity  . Alcohol use: Not on file  . Drug use: Not on file  . Sexual activity: Not on file  Lifestyle  . Physical activity:    Days per week: Not on file    Minutes per session: Not on file  . Stress: Not on file  Relationships  . Social connections:    Talks on phone: Not on file    Gets together: Not on file    Attends religious service: Not on file    Active member of club or organization: Not on file    Attends meetings of clubs or organizations: Not on file    Relationship status: Not on file  . Intimate partner violence:    Fear of current or ex partner: Not on file    Emotionally abused: Not on file    Physically abused: Not on file    Forced sexual activity: Not on file  Other Topics Concern  . Not on file  Social History Narrative  . Not on file    Family History:   Mother and father died of cancer No significant family history of coronary disease or heart failure that he can recall  ROS:  Please see the history of present illness.  All other ROS reviewed and negative.     Physical Exam/Data:   Vitals:   01/06/2019 2305 12/16/2018 2331 12/13/18 0018 12/13/18 0036  BP: (!) 120/106 (!) 88/75 (!) 74/61 (!) 74/60  Pulse:  (!) 111 65   Resp:  (!) 29 (!) 21   Temp:  97.8 F (36.6 C) 97.6 F (36.4 C)   TempSrc:  Oral Oral   SpO2:  (!) 87% 90%   Weight:  83 kg    Height:   (1.88 m)      Intake/Output Summary (Last 24 hours) at 12/13/2018 0110 Last data filed at 12/29/2018 2352 Gross per 24 hour  Intake -  Output 225 ml  Net -225 ml   Last 3 Weights 12/19/2018  Weight (lbs) 183 lb  Weight (kg) 83.008 kg     Body mass index is 23.5 kg/m.  General:  Sleeping comfortably in bed, lying at 30 degrees comfortably HEENT: normal Lymph: no adenopathy Neck: JVP elevated above the mandible (at least at the ear lobe) when lying at 30 degrees  Endocrine:  No thryomegaly Cardiac:  Tachycardic,  regular rhythm with occasional skipped beats on auscultation correlating to PVCs on telemetry. II/VI systolic murmur over the apex  Lungs:  Breathing comfortably on nasal canula. Absent breath sounds at the bases with fairly clear lung sounds at the apices   Abd: soft, nontender, no  hepatomegaly  Ext: 2+ pitting edema to the mid-thighs bilaterally  Skin: warm and dry  Neuro:  CNs 2-12 intact, no focal abnormalities noted Psych:  Normal affect   EKG:  The EKG was personally reviewed and demonstrates:  Sinus tachycardia with incomplete RBBB, no ischemic changes Telemetry:  Telemetry was personally reviewed and demonstrates:  Sinus tachycardia with occasional PVCs  Relevant CV Studies: None  Laboratory Data:  Chemistry Recent Labs  Lab 12/25/2018 1630 12/15/2018 2003  NA 141 138  K 4.6 3.8  CL 108  --   CO2 14*  --   GLUCOSE 99  --   BUN 36*  --   CREATININE 1.58*  --   CALCIUM 9.0  --   GFRNONAA 45*  --   GFRAA 52*  --   ANIONGAP 19*  --     Recent Labs  Lab 01/01/2019 1630  PROT 6.4*  ALBUMIN 2.9*  AST 53*  ALT <5  ALKPHOS 84  BILITOT 2.2*   Hematology Recent Labs  Lab 12/24/2018 1630 12/15/2018 2003  WBC 26.5*  --   RBC 5.02  --   HGB 10.4* 9.9*  HCT 38.3* 29.0*  MCV 76.3*  --   MCH 20.7*  --   MCHC 27.2*  --   RDW 21.9*  --   PLT 219  --    Cardiac Enzymes Recent Labs  Lab 01/08/2019 1630 12/14/2018 2235  TROPONINI 0.09* 0.09*   No results for input(s): TROPIPOC in the last 168 hours.  BNP Recent Labs  Lab 12/13/2018 1630  BNP 1,859.8*    DDimer No results for input(s): DDIMER in the last 168 hours.  Radiology/Studies:  Dg Chest Port 1 View  Result Date: 12/27/2018 CLINICAL DATA:  Shortness of breath. EXAM: PORTABLE CHEST 1 VIEW COMPARISON:  01/14/2005 FINDINGS: There is a moderate right effusion and a small left effusion. There is bilateral interstitial pulmonary edema. Cardiomegaly. Pulmonary vascularity is slightly prominent. No acute bone abnormality.  Median sternotomy. IMPRESSION: 1. Bilateral pulmonary edema with bilateral pleural effusions, right greater than left. 2. Cardiomegaly with mild pulmonary vascular congestion. Findings are consistent with congestive heart failure. Electronically Signed   By: Francene Boyers M.D.   On: 01/05/2019 16:47    Assessment and Plan:   AEDON DEASON is a 68 y.o. male with a hx of mitral valve replacement, COPD, HLD, CVA, and possible ICM who is being seen today for the evaluation of swelling at the request of the internal medicine team. His case is challenging at this time as we have no history in our system and Daniel Price is a poor historian. Clinically, he is in heart failure with significantly elevated JVP, pulmonary edema and lower extremity edema. Fortunately, he is warm to the touch, alert, oriented, and with only mild elevations in bilirubin and creatinine suggesting overall preserved perfusion. He would certainly benefit from aggressive diuresis to further improve his perfusion. I do not think he needs pressor/inotropic support at this time given this. His mitral valve history is uncertain. I do not hear any mechanical heart sounds on exam but it would be odd to have replaced a mitral valve in a 68 y/o ish patient and to use a bioprosthetic valve. He does have a murmur over his apex but I do not hear a diastolic component to the murmur which would be expected in MS. Certainly, his echocardiogram in the morning will be beneficial. Lastly, he does have a significant leukocytosis with raising concern for underlying infectious  process.  - Recommend lasix 80mg  IV now with strict I/Os - Monitor potassium and magnesium BID with significant diuresis with goal K>4 and Mg>2 - Monitor on tele  - Goal -1 to 2L  - Echo ordered, will coordinate with echo staff to complete first thing in AM - Hold beta blockade  - [facility] soon as possible - If he becomes hypotensive, would recommend  levophed to maintain MAPs >65     For questions or updates, please contact [facility] Please consult [email or web address] for contact info under     Signed, Nicoletta Baoniglio, Shone Leventhal C, MD  12/13/2018 1:10 AM

## 2018-12-13 NOTE — Progress Notes (Addendum)
Patient's BP readings were in the 70s systolic.  Next dose of lasix was not given.  Patient asymptomatic.  Dr. Delma Officer called me to take manual BP, still SBP in the 70s.  MD order for patient to start on Levophed gtt.  Advise that we are unable to place patient on that gtt on this floor.  MD spoke with charge nurse Jorene Guest) and rapid respose will be coming up to start gtt.  Cardiology and teaching services going back and forth, will hold on Levophed gtt and see if BP stabilize.  Patient will remain on this floor and be monitor.  If BP changes, rapid response will need to be called in.  I will keep monitoring patient.

## 2018-12-13 NOTE — Progress Notes (Signed)
Spoke with Traci RN re PICC order.  States pt has a CVC at this time and will cancel order for PICC.

## 2018-12-13 NOTE — Progress Notes (Signed)
  Echocardiogram 2D Echocardiogram has been performed.  Daniel Price 12/13/2018, 9:23 AM

## 2018-12-13 NOTE — Progress Notes (Signed)
  Patient became acutely worse. More SOB and hypoxemic with prominent air hunger. SBP in 80s  CXR with diffuse pulmonary edema. I personally helped transport to 2H with RRT and bedside RN.   Placed on BIPAP and given IV lasix. Milrinone and NE started. With eventual improvement.   CCM at bedside. Need for intubation discussed but symptoms improved and ABG ok so will continue Bipap for now.   Additional 60 mins CCT.  Arvilla Meres, MD  4:03 PM

## 2018-12-13 NOTE — Significant Event (Signed)
Rapid Response Event Note  Overview:Called d/t need for pressors and ICU transfer. Pt SBP-70s. Time Called: 0048 Arrival Time: 0055 Event Type: Hypotension  Initial Focused Assessment: Pt laying in bed, alert and oriented, skin cool and dry. T-97.6, HR-105, BP-91/73, RR-22, SpO2-97% on 4L [place]. Lungs with scant crackles in upper lobes, diminished in lower lobes.  Murmur present. JVP +, 3+ pitting edema present in BLE.  Interventions: Levophed gtt originally ordered, however, pts SBP now 90s on levo on hold at this time. 80mg  lasix ordered per cardiology Plan of Care (if not transferred): Give lasix, monitor BP, notify RR if further assistance needed. Event Summary: Name of Physician Notified: Chundi at (PTA RRT)  Name of Consulting Physician Notified: Coniglio at (PTA RRT)  Outcome: Stayed in room and stabalized  Event End Time: 0130  Terrilyn Saver

## 2018-12-13 NOTE — Progress Notes (Addendum)
Progress Note  Patient Name: Daniel Price Date of Encounter: 12/13/2018  Primary Cardiologist: [facility]  Subjective   Denies any recent CP or SOB, only reason he came in is due to leg swelling. No dizziness.  Inpatient Medications    Scheduled Meds: . aerochamber plus with mask  1 each Other Once  . atorvastatin  40 mg Oral q1800  . clopidogrel  75 mg Oral QPM  . heparin  5,000 Units Subcutaneous Q8H  . pantoprazole  40 mg Oral QPM   Continuous Infusions:  PRN Meds: acetaminophen **OR** acetaminophen, Ipratropium-Albuterol, promethazine, senna-docusate   Vital Signs    Vitals:   12/13/18 0432 12/13/18 0658 12/13/18 0734 12/13/18 0828  BP: (!) 90/58  96/74 (!) 82/53  Pulse:    (!) 110  Resp: 16  (!) 24 18  Temp:  98 F (36.7 C)  97.7 F (36.5 C)  TempSrc:  Oral  Oral  SpO2:  98% 96% 95%  Weight: 83 kg     Height:        Intake/Output Summary (Last 24 hours) at 12/13/2018 0920 Last data filed at 12/13/2018 0511 Gross per 24 hour  Intake 360 ml  Output 415 ml  Net -55 ml   Last 3 Weights 12/13/2018 12/16/2018  Weight (lbs) 182 lb 15.7 oz 183 lb  Weight (kg) 83 kg 83.008 kg      Telemetry    Sinus tachycardia - Personally Reviewed  ECG    Sinus tachycardia - Personally Reviewed  Physical Exam   See MD assessment   Labs    Chemistry Recent Labs  Lab Dec 16, 2018 1630 Dec 16, 2018 2003 12/13/18 0444  NA 141 138 138  K 4.6 3.8 3.9  CL 108  --  107  CO2 14*  --  18*  GLUCOSE 99  --  164*  BUN 36*  --  38*  CREATININE 1.58*  --  1.47*  CALCIUM 9.0  --  8.8*  PROT 6.4*  --   --   ALBUMIN 2.9*  --   --   AST 53*  --   --   ALT <5  --   --   ALKPHOS 84  --   --   BILITOT 2.2*  --   --   GFRNONAA 45*  --  49*  GFRAA 52*  --  57*  ANIONGAP 19*  --  13     Hematology Recent Labs  Lab 12-16-18 1630 12-16-2018 2003 12/13/18 0444  WBC 26.5*  --  21.8*  RBC 5.02  --  4.61  HGB 10.4* 9.9* 9.3*  HCT 38.3* 29.0* 33.1*  MCV 76.3*  --  71.8*  MCH  20.7*  --  20.2*  MCHC 27.2*  --  28.1*  RDW 21.9*  --  21.2*  PLT 219  --  155    Cardiac Enzymes Recent Labs  Lab 2018-12-16 1630 12-16-18 2235 12/13/18 0444  TROPONINI 0.09* 0.09* 0.09*   No results for input(s): TROPIPOC in the last 168 hours.   BNP Recent Labs  Lab 12/16/2018 1630  BNP 1,859.8*     DDimer No results for input(s): DDIMER in the last 168 hours.   Radiology    Dg Chest Port 1 View  Result Date: 16-Dec-2018 CLINICAL DATA:  Shortness of breath. EXAM: PORTABLE CHEST 1 VIEW COMPARISON:  01/14/2005 FINDINGS: There is a moderate right effusion and a small left effusion. There is bilateral interstitial pulmonary edema. Cardiomegaly. Pulmonary vascularity is slightly prominent. No  acute bone abnormality. Median sternotomy. IMPRESSION: 1. Bilateral pulmonary edema with bilateral pleural effusions, right greater than left. 2. Cardiomegaly with mild pulmonary vascular congestion. Findings are consistent with congestive heart failure. Electronically Signed   By: Francene BoyersJames  Maxwell M.D.   On: 2018/08/17 16:47   [place] Ekg Site Rite  Result Date: 12/13/2018 If Site Rite image not attached, placement could not be confirmed due to current cardiac rhythm.   Cardiac Studies   Pending echo  Patient Profile     68 y.o. male with PMH of MV replacement, COPD, HLD, CVA and possible ICM who presented with 2 weeks onset of LE edema. Denies any CP. 3+ pitting edema bilaterally. He became hypotensive after IV diuresis.   Assessment & Plan    1. Acute on chronic CHF: likely more RHC with a component of Left heart failure  - BNP 1859  - treatment limited by lack of previous record, pending echo result  - received 20mg  lasix last night and 80mg  IV lasix this morning. Avoid aggressive diuresis to maintain preload. Limited urine output so far.  - Dr. Gala RomneyBensimhon to evaluate the patient. Ideally need co-ox number or RHC to help guide treatment   2. Hypotension: soft diuresis only to avoid  significant drop in preload. Pending echocardiogram to help guide treatment  - levophed used this morning, but since stopped  3. MV replacement: pending echocardiogram  4. Leukocytosis: WBC 26.5 on arrival. Unclear cause, afebrile  5. HLD: on lipitor  6. Possible ICM: although patient denies ever having a heart cath even prior to MVR for some reason.        For questions or updates, please contact [facility] Please consult [email or web address] for contact info under       Signed, Azalee CourseHao Meng, PA  12/13/2018, 9:20 AM    Patient seen and examined with the above-signed Advanced Practice Provider and/or Housestaff. I personally reviewed laboratory data, imaging studies and relevant notes. I independently examined the patient and formulated the important aspects of the plan. I have edited the note to reflect any of my changes or salient points. I have personally discussed the plan with the patient and/or family.  68 y/o male with h/o COPD, HL, CVA and mitral regurgitation s/p bioprosthetic MVR with Dr. Donata ClayVan Trigt in 2006 admitted with profound HF and respiratory distress.   He is unable to give me much details about his situation. Says he lives alone but sister helps him with meds. K[facility] but couldn't remember when. Recognized Dr. Zenaida NieceVan Trigt's name when I said it.  Says he came in to the hospital because he "had a heart attack". Denies CP but has had worsening SOB and edema for weeks.   Came to ER last night and was very SOB and edematous. SBP in 80s. Bicarb 14. Admitted to FPTs.  This am much more SOB with little response to lasix. Moved to SDU.   Echo performed. LVEF ok. MV thickened and appears stenotic. Mean gradient ~20. RV markedly dialted and severely HK.   On exam Sitting straight up in bed tachypneic and dyspneic JVP to ear Cor tachy regular  +RV lift. 2/6 TR/MR + MS Lungs crackles 1/3 up Ab distended liver edge down Ext cool with 4+ edema  He  has severe biventricular HF in setting of longstanding MV disease. Now currently in cardiogenic shock. On echo MV seems thickened and stenotic but gradients amy be artificially high.   Will place PICC, start  milrinone. Follow CVP and co-ox. Continue IV lasix. Add midodrine for BP support. Place UNNA boots. Low threshold to move to ICU. Will need R/L cath once improved.   CRITICAL CARE Performed by: Arvilla Meres  Total critical care time: 45 minutes  Critical care time was exclusive of separately billable procedures and treating other patients.  Critical care was necessary to treat or prevent imminent or life-threatening deterioration.  Critical care was time spent personally by me (independent of midlevel providers or residents) on the following activities: development of treatment plan with patient and/or surrogate as well as nursing, discussions with consultants, evaluation of patient's response to treatment, examination of patient, obtaining history from patient or surrogate, ordering and performing treatments and interventions, ordering and review of laboratory studies, ordering and review of radiographic studies, pulse oximetry and re-evaluation of patient's condition.  Arvilla Meres, MD  11:55 AM

## 2018-12-14 ENCOUNTER — Inpatient Hospital Stay (HOSPITAL_COMMUNITY): Payer: Medicare Other

## 2018-12-14 DIAGNOSIS — J449 Chronic obstructive pulmonary disease, unspecified: Secondary | ICD-10-CM | POA: Diagnosis present

## 2018-12-14 DIAGNOSIS — I671 Cerebral aneurysm, nonruptured: Secondary | ICD-10-CM

## 2018-12-14 DIAGNOSIS — R570 Cardiogenic shock: Secondary | ICD-10-CM | POA: Diagnosis not present

## 2018-12-14 DIAGNOSIS — N179 Acute kidney failure, unspecified: Secondary | ICD-10-CM | POA: Diagnosis present

## 2018-12-14 DIAGNOSIS — J9601 Acute respiratory failure with hypoxia: Secondary | ICD-10-CM | POA: Diagnosis present

## 2018-12-14 DIAGNOSIS — I5023 Acute on chronic systolic (congestive) heart failure: Secondary | ICD-10-CM

## 2018-12-14 DIAGNOSIS — D509 Iron deficiency anemia, unspecified: Secondary | ICD-10-CM | POA: Diagnosis present

## 2018-12-14 LAB — PHOSPHORUS
Phosphorus: 4 mg/dL (ref 2.5–4.6)
Phosphorus: 4.8 mg/dL — ABNORMAL HIGH (ref 2.5–4.6)

## 2018-12-14 LAB — BASIC METABOLIC PANEL
Anion gap: 12 (ref 5–15)
Anion gap: 13 (ref 5–15)
BUN: 44 mg/dL — ABNORMAL HIGH (ref 8–23)
BUN: 46 mg/dL — ABNORMAL HIGH (ref 8–23)
CO2: 20 mmol/L — ABNORMAL LOW (ref 22–32)
CO2: 19 mmol/L — ABNORMAL LOW (ref 22–32)
Calcium: 8.5 mg/dL — ABNORMAL LOW (ref 8.9–10.3)
Calcium: 8.2 mg/dL — ABNORMAL LOW (ref 8.9–10.3)
Chloride: 106 mmol/L (ref 98–111)
Chloride: 104 mmol/L (ref 98–111)
Creatinine, Ser: 1.41 mg/dL — ABNORMAL HIGH (ref 0.61–1.24)
Creatinine, Ser: 1.58 mg/dL — ABNORMAL HIGH (ref 0.61–1.24)
GFR calc Af Amer: 60 mL/min — ABNORMAL LOW (ref >60)
GFR calc Af Amer: 52 mL/min — ABNORMAL LOW (ref >60)
GFR calc non Af Amer: 52 mL/min — ABNORMAL LOW (ref >60)
GFR calc non Af Amer: 45 mL/min — ABNORMAL LOW (ref >60)
Glucose, Bld: 130 mg/dL — ABNORMAL HIGH (ref 70–99)
Glucose, Bld: 165 mg/dL — ABNORMAL HIGH (ref 70–99)
Potassium: 3.4 mmol/L — ABNORMAL LOW (ref 3.5–5.1)
Potassium: 2.6 mmol/L — CL (ref 3.5–5.1)
Sodium: 138 mmol/L (ref 135–145)
Sodium: 136 mmol/L (ref 135–145)

## 2018-12-14 LAB — CBC
HCT: 31.8 % — ABNORMAL LOW (ref 39.0–52.0)
Hemoglobin: 9.3 g/dL — ABNORMAL LOW (ref 13.0–17.0)
MCH: 20.5 pg — ABNORMAL LOW (ref 26.0–34.0)
MCHC: 29.2 g/dL — ABNORMAL LOW (ref 30.0–36.0)
MCV: 70.2 fL — ABNORMAL LOW (ref 80.0–100.0)
Platelets: 197 10*3/uL (ref 150–400)
RBC: 4.53 MIL/uL (ref 4.22–5.81)
RDW: 21.2 % — ABNORMAL HIGH (ref 11.5–15.5)
WBC: 28.9 10*3/uL — ABNORMAL HIGH (ref 4.0–10.5)
nRBC: 1.9 % — ABNORMAL HIGH (ref 0.0–0.2)

## 2018-12-14 LAB — POCT I-STAT 7, (LYTES, BLD GAS, ICA,H+H)
Acid-base deficit: 4 mmol/L — ABNORMAL HIGH (ref 0.0–2.0)
Bicarbonate: 20.6 mmol/L (ref 20.0–28.0)
Calcium, Ion: 1.19 mmol/L (ref 1.15–1.40)
HCT: 31 % — ABNORMAL LOW (ref 39.0–52.0)
Hemoglobin: 10.5 g/dL — ABNORMAL LOW (ref 13.0–17.0)
O2 Saturation: 98 %
Patient temperature: 98.4
Potassium: 2.6 mmol/L — CL (ref 3.5–5.1)
Sodium: 138 mmol/L (ref 135–145)
TCO2: 22 mmol/L (ref 22–32)
pCO2 arterial: 36.5 mmHg (ref 32.0–48.0)
pH, Arterial: 7.36 (ref 7.350–7.450)
pO2, Arterial: 108 mmHg (ref 83.0–108.0)

## 2018-12-14 LAB — COOXEMETRY PANEL
Carboxyhemoglobin: 1.4 % (ref 0.5–1.5)
Carboxyhemoglobin: 1.5 % (ref 0.5–1.5)
Carboxyhemoglobin: 1.8 % — ABNORMAL HIGH (ref 0.5–1.5)
Methemoglobin: 1.7 % — ABNORMAL HIGH (ref 0.0–1.5)
Methemoglobin: 1.6 % — ABNORMAL HIGH (ref 0.0–1.5)
Methemoglobin: 2.1 % — ABNORMAL HIGH (ref 0.0–1.5)
O2 Saturation: 39.7 %
O2 Saturation: 39.1 %
O2 Saturation: 66 %
Total hemoglobin: 9.8 g/dL — ABNORMAL LOW (ref 12.0–16.0)
Total hemoglobin: 9.4 g/dL — ABNORMAL LOW (ref 12.0–16.0)
Total hemoglobin: 9.1 g/dL — ABNORMAL LOW (ref 12.0–16.0)

## 2018-12-14 LAB — LACTIC ACID, PLASMA
Lactic Acid, Venous: 2.1 mmol/L (ref 0.5–1.9)
Lactic Acid, Venous: 2 mmol/L (ref 0.5–1.9)

## 2018-12-14 LAB — IRON AND TIBC
Iron: 11 ug/dL — ABNORMAL LOW (ref 45–182)
Saturation Ratios: 3 % — ABNORMAL LOW (ref 17.9–39.5)
TIBC: 333 ug/dL (ref 250–450)
UIBC: 322 ug/dL

## 2018-12-14 LAB — HEPARIN LEVEL (UNFRACTIONATED): Heparin Unfractionated: <0.1 IU/mL — ABNORMAL LOW (ref 0.30–0.70)

## 2018-12-14 LAB — FERRITIN: Ferritin: 53 ng/mL (ref 24–336)

## 2018-12-14 LAB — GLUCOSE, CAPILLARY: Glucose-Capillary: 167 mg/dL — ABNORMAL HIGH (ref 70–99)

## 2018-12-14 LAB — T4, FREE: Free T4: 1.39 ng/dL (ref 0.82–1.77)

## 2018-12-14 LAB — MAGNESIUM
Magnesium: 2 mg/dL (ref 1.7–2.4)
Magnesium: 2.1 mg/dL (ref 1.7–2.4)

## 2018-12-14 LAB — MRSA PCR SCREENING: MRSA by PCR: NEGATIVE

## 2018-12-14 MED ORDER — AMIODARONE LOAD VIA INFUSION
150.0000 mg | Freq: Once | INTRAVENOUS | Status: AC
  Administered 2018-12-14: 150 mg via INTRAVENOUS
  Filled 2018-12-14: qty 83.34

## 2018-12-14 MED ORDER — BISACODYL 10 MG RE SUPP: 10.0000 mg | Freq: Every day | RECTAL | Status: DC | PRN

## 2018-12-14 MED ORDER — FENTANYL CITRATE (PF) 100 MCG/2ML IJ SOLN: 100.0000 ug | Freq: Once | INTRAMUSCULAR | Status: AC

## 2018-12-14 MED ORDER — METOLAZONE 2.5 MG PO TABS
2.5000 mg | ORAL_TABLET | Freq: Once | ORAL | Status: AC
  Administered 2018-12-14: 2.5 mg via ORAL
  Filled 2018-12-14: qty 1

## 2018-12-14 MED ORDER — FENTANYL BOLUS VIA INFUSION
25.0000 ug | INTRAVENOUS | Status: DC | PRN
  Administered 2018-12-17 – 2018-12-18 (×6): 25 ug via INTRAVENOUS
  Filled 2018-12-14: qty 25

## 2018-12-14 MED ORDER — ACETAMINOPHEN 325 MG PO TABS
650.0000 mg | ORAL_TABLET | Freq: Four times a day (QID) | ORAL | Status: DC | PRN
  Administered 2018-12-17: 650 mg
  Filled 2018-12-14: qty 2

## 2018-12-14 MED ORDER — ATORVASTATIN CALCIUM 40 MG PO TABS
40.0000 mg | ORAL_TABLET | Freq: Every day | ORAL | Status: DC
  Administered 2018-12-15 – 2018-12-17 (×3): 40 mg
  Filled 2018-12-14 (×3): qty 1

## 2018-12-14 MED ORDER — ACETAMINOPHEN 650 MG RE SUPP: 650.0000 mg | Freq: Four times a day (QID) | RECTAL | Status: DC | PRN

## 2018-12-14 MED ORDER — IPRATROPIUM-ALBUTEROL 0.5-2.5 (3) MG/3ML IN SOLN: 3.0000 mL | Freq: Four times a day (QID) | RESPIRATORY_TRACT | Status: DC

## 2018-12-14 MED ORDER — HEPARIN (PORCINE) 25000 UT/250ML-% IV SOLN
1400.0000 [IU]/h | INTRAVENOUS | Status: DC
  Administered 2018-12-14 – 2018-12-16 (×3): 1200 [IU]/h via INTRAVENOUS
  Administered 2018-12-17 – 2018-12-18 (×2): 1400 [IU]/h via INTRAVENOUS
  Filled 2018-12-14 (×5): qty 250

## 2018-12-14 MED ORDER — AMIODARONE HCL IN DEXTROSE 360-4.14 MG/200ML-% IV SOLN
30.0000 mg/h | INTRAVENOUS | Status: DC
  Administered 2018-12-15 (×2): 60 mg/h via INTRAVENOUS
  Administered 2018-12-15: 30 mg/h via INTRAVENOUS
  Administered 2018-12-16 (×3): 60 mg/h via INTRAVENOUS
  Administered 2018-12-17 – 2018-12-18 (×3): 30 mg/h via INTRAVENOUS
  Filled 2018-12-14 (×11): qty 200

## 2018-12-14 MED ORDER — VITAL HIGH PROTEIN PO LIQD: 1000.0000 mL | ORAL | Status: DC

## 2018-12-14 MED ORDER — POTASSIUM CHLORIDE CRYS ER 20 MEQ PO TBCR
40.0000 meq | EXTENDED_RELEASE_TABLET | Freq: Once | ORAL | Status: AC
  Administered 2018-12-14: 40 meq via ORAL
  Filled 2018-12-14: qty 2

## 2018-12-14 MED ORDER — AMIODARONE HCL IN DEXTROSE 360-4.14 MG/200ML-% IV SOLN
60.0000 mg/h | INTRAVENOUS | Status: AC
  Administered 2018-12-14 (×2): 60 mg/h via INTRAVENOUS
  Filled 2018-12-14: qty 200

## 2018-12-14 MED ORDER — FENTANYL CITRATE (PF) 100 MCG/2ML IJ SOLN
50.0000 ug | Freq: Once | INTRAMUSCULAR | Status: DC
  Filled 2018-12-14: qty 2

## 2018-12-14 MED ORDER — FENTANYL 2500MCG IN NS 250ML (10MCG/ML) PREMIX INFUSION
25.0000 ug/h | INTRAVENOUS | Status: DC
  Administered 2018-12-14: 50 ug/h via INTRAVENOUS
  Administered 2018-12-16 – 2018-12-17 (×2): 100 ug/h via INTRAVENOUS
  Filled 2018-12-14: qty 250
  Filled 2018-12-14: qty 750
  Filled 2018-12-14: qty 250

## 2018-12-14 MED ORDER — MIDAZOLAM HCL 2 MG/2ML IJ SOLN
2.0000 mg | Freq: Once | INTRAMUSCULAR | Status: AC
  Administered 2018-12-14: 2 mg via INTRAVENOUS

## 2018-12-14 MED ORDER — NOREPINEPHRINE 16 MG/250ML-% IV SOLN
0.0000 ug/min | INTRAVENOUS | Status: DC
  Administered 2018-12-14: 10 ug/min via INTRAVENOUS
  Administered 2018-12-15: 45 ug/min via INTRAVENOUS
  Administered 2018-12-15: 33 ug/min via INTRAVENOUS
  Administered 2018-12-15: 70 ug/min via INTRAVENOUS
  Administered 2018-12-16: 88 ug/min via INTRAVENOUS
  Administered 2018-12-16: 84 ug/min via INTRAVENOUS
  Administered 2018-12-16: 100 ug/min via INTRAVENOUS
  Administered 2018-12-16: 65 ug/min via INTRAVENOUS
  Administered 2018-12-16: 68 ug/min via INTRAVENOUS
  Administered 2018-12-16: 72 ug/min via INTRAVENOUS
  Administered 2018-12-17: 70 ug/min via INTRAVENOUS
  Administered 2018-12-17: 55 ug/min via INTRAVENOUS
  Administered 2018-12-17: 40 ug/min via INTRAVENOUS
  Administered 2018-12-18: 85 ug/min via INTRAVENOUS
  Administered 2018-12-18: 50 ug/min via INTRAVENOUS
  Administered 2018-12-18 (×2): 100 ug/min via INTRAVENOUS
  Filled 2018-12-14: qty 250
  Filled 2018-12-14: qty 500
  Filled 2018-12-14 (×7): qty 250
  Filled 2018-12-14: qty 500
  Filled 2018-12-14 (×9): qty 250

## 2018-12-14 MED ORDER — CLOPIDOGREL BISULFATE 75 MG PO TABS
75.0000 mg | ORAL_TABLET | Freq: Every evening | ORAL | Status: DC
  Administered 2018-12-15 – 2018-12-16 (×2): 75 mg
  Filled 2018-12-14 (×3): qty 1

## 2018-12-14 MED ORDER — MIDAZOLAM HCL 2 MG/2ML IJ SOLN
INTRAMUSCULAR | Status: AC
  Filled 2018-12-14: qty 2

## 2018-12-14 MED ORDER — MIDAZOLAM HCL 2 MG/2ML IJ SOLN
1.0000 mg | INTRAMUSCULAR | Status: DC | PRN
  Administered 2018-12-14 – 2018-12-17 (×2): 1 mg via INTRAVENOUS
  Filled 2018-12-14 (×4): qty 2

## 2018-12-14 MED ORDER — FENTANYL CITRATE (PF) 100 MCG/2ML IJ SOLN
INTRAMUSCULAR | Status: AC
  Administered 2018-12-14: 100 ug
  Filled 2018-12-14: qty 2

## 2018-12-14 MED ORDER — CHLORHEXIDINE GLUCONATE 0.12% ORAL RINSE (MEDLINE KIT)
15.0000 mL | Freq: Two times a day (BID) | OROMUCOSAL | Status: DC
  Administered 2018-12-14 – 2018-12-18 (×8): 15 mL via OROMUCOSAL

## 2018-12-14 MED ORDER — POTASSIUM CHLORIDE 10 MEQ/50ML IV SOLN
10.0000 meq | INTRAVENOUS | Status: AC
  Administered 2018-12-14 – 2018-12-15 (×4): 10 meq via INTRAVENOUS
  Filled 2018-12-14 (×3): qty 50

## 2018-12-14 MED ORDER — ORAL CARE MOUTH RINSE
15.0000 mL | OROMUCOSAL | Status: DC
  Administered 2018-12-14 – 2018-12-18 (×30): 15 mL via OROMUCOSAL

## 2018-12-14 MED ORDER — ETOMIDATE 2 MG/ML IV SOLN
10.0000 mg | Freq: Once | INTRAVENOUS | Status: AC
  Administered 2018-12-14: 20 mg via INTRAVENOUS

## 2018-12-14 MED ORDER — SODIUM CHLORIDE 0.9 % IV SOLN
510.0000 mg | Freq: Once | INTRAVENOUS | Status: AC
  Administered 2018-12-14: 510 mg via INTRAVENOUS
  Filled 2018-12-14: qty 17

## 2018-12-14 MED ORDER — IPRATROPIUM-ALBUTEROL 0.5-2.5 (3) MG/3ML IN SOLN: 3.0000 mL | Freq: Four times a day (QID) | RESPIRATORY_TRACT | Status: DC | PRN

## 2018-12-14 MED ORDER — PANTOPRAZOLE SODIUM 40 MG PO PACK
40.0000 mg | PACK | ORAL | Status: DC
  Administered 2018-12-14 – 2018-12-17 (×4): 40 mg
  Filled 2018-12-14 (×4): qty 20

## 2018-12-14 MED ORDER — DOCUSATE SODIUM 50 MG/5ML PO LIQD: 100.0000 mg | Freq: Two times a day (BID) | ORAL | Status: DC | PRN

## 2018-12-14 MED ORDER — ROCURONIUM BROMIDE 50 MG/5ML IV SOLN
60.0000 mg | Freq: Once | INTRAVENOUS | Status: AC
  Administered 2018-12-14: 18:00:00 60 mg via INTRAVENOUS

## 2018-12-14 MED ORDER — PRO-STAT SUGAR FREE PO LIQD
30.0000 mL | Freq: Two times a day (BID) | ORAL | Status: DC
  Administered 2018-12-14 – 2018-12-15 (×2): 30 mL
  Filled 2018-12-14 (×2): qty 30

## 2018-12-14 NOTE — Progress Notes (Addendum)
Subjective: Yesterday afternoon, Daniel Price became hypotensive with worsening dyspnea. CXR showed diffuse pulmonary edema. He was placed on bipap, started on drips of lasix, milrinone, and levophed, and was moved to the cardiac ICU. Per RN, she noticed when she came on shift this morning that Daniel Price was tachypneic and tachycardic and appeared to be in distress on 5L [place], so he was placed back on Bipap. This morning, pt reports that he feels short of breath. He endorses pain along the right anterior inferior rib margin that worsens with breathing. This pain started overnight. He is hungry and ordered breakfast, but now he cannot eat because of the bipap. He does not think his lower extremity swelling has improved. All of his questions were addressed.   Spoke with patient's sister Daniel Price via telephone. She is very knowledgeable about her brother's medical history. She states that he had the bioprosthetic mitral valve placed by Dr. Donata Clay in 2006. Since then, he has followed with a cardiologist at the [place]. He last had an ECHO about two months ago. He has not had any issues with his prosthetic valve since its placement. He also follows regularly with his PCP. His gets annual brain MRIs to monitor a cerebral aneurysm. He is next scheduled for an MRI in July. His sister reports that he has appeared short of breath for about two months now and she has been trying to convince him to go to the hospital for the last month. He lives alone and is independent in all ADLs.   [facility]  Objective:  Vital signs in last 24 hours: Vitals:   12/14/18 0300 12/14/18 0349 12/14/18 0400 12/14/18 0500  BP: 95/81  111/74 100/76  Pulse: (!) 106  (!) 124 (!) 126  Resp: (!) 27  (!) 22 (!) 21  Temp:      TempSrc:      SpO2: (!) 80%  (!) 83% (!) 78%  Weight:  83.4 kg    Height:       Gen: sitting up straight in bed, on bipap CV: Tachycardic, systolic murmur Pulm: On bipap, tachypneic,  decreased breath sounds at the bases, no wheezing Ext: 3+ pitting edema BLE   Assessment/Plan:  Active Problems:   Acute exacerbation of CHF (congestive heart failure) (HCC)   Pressure injury of skin  Acute CHF failure/Cardiogenic shock:  - ECHO: EF 45-50%, RV wall thickness and severely reduced RV systolic function, moderate pericardial effusion, bioprosthetic mitral valve, moderate tricuspid regurg, dilated IVC - Patient appears distressed this morning with tachycardia to the 130s and tachypnea. His blood pressures have fluctuated widely from 85/57 to 217/200. Pulse ox readings have been inaccurate during this hospitalization despite trying different locations, likely from decreased perfusion to the extremities. His sats ranged in the 70s-80s overnight, but this is thought to be inaccurate as an ABG yesterday had a pO2 of 305. - Diuresed 1.7L yesterday; net negative ; weight stable at 183 lbs - Co-ox 39% this morning, decreased from 48% yesterday despite starting milrinone. Lactate 2.1. Plan - Heart failure following, appreciate recs - Continue milrinone, increasing rate per HF today - Continue levophed - Continue lasix drip, increasing rate per HF today - Metolazone 2.5mg   - Trend K and Mag, replace as needed  - Will need heart cath when stable   Probable Afib/Aflutter: No hx of arrhythmias. Per Dr. Gala Romney, tele showed sinus tach with periods of possible afib/aflutter. - Start amiodarone  - Heparin  AKI vs CKD: Cr 1.58 on admission. Unknown baseline. Possibly cardiorenal syndrome vs CKD. Cr 1.41 today. - Continue diuresis as above - Trend renal function   Leukocytosis: Likely stress reaction secondary to shock as no infectious source has been identified. White count 28 this am (increased from 21) - Trend CBC - F/u blood cx  Iron deficiency anemia: Hb 9.3, MCV 70. Iron 11 with 3% saturation. Ferritin wnl.  - Feraheme  COPD: No acute exacerbation. Continue Combivent 1  puff q6h  Hypertension: Holding home sinopril and metoprolol in the setting of acute decompensated heart failure. Hx of TIA/Stroke: Continue atorvastatin 40 mg daily and Plavix 75 mg daily  Dispo: Not medically stable for discharge as patient is still requiring pressor support.  Daniel Price, Daniel Cortieborah N, MD 12/14/2018, 6:34 AM Pager: [phone]

## 2018-12-14 NOTE — Progress Notes (Signed)
[facility] [place] [facility] records.  They are closed at this time.  Release of Information form (on their website) completed with patient signature and faxed to [phone]. The regular landline number for the Release of Information office at [facility] is [phone] ext [phone].    Now that patient's cell phone is charged.  He has given [place] the phone number for Gavin Pound, sister, as [phone].

## 2018-12-14 NOTE — Procedures (Signed)
Intubation Procedure Note Daniel Price [id number] 09-Oct-1950  Procedure: Intubation Indications: Respiratory insufficiency  Procedure Details Consent: Risks of procedure as well as the alternatives and risks of each were explained to the (patient/caregiver).  Consent for procedure obtained. Time Out: Verified patient identification, verified procedure, site/side was marked, verified correct patient position, special equipment/implants available, medications/allergies/relevent history reviewed, required imaging and test results available.  Performed  Maximum sterile technique was used including gloves, hand hygiene and mask.  MAC and 4  Given 2 mg versed, 50 mcg fentanyl, 20 mg etomidate, 60 mg rocuronium.  Inserted 7.5 ETT to 23 cm at lip using glidescope.  Confirmed with CO2 detector and ausculation.  Evaluation Hemodynamic Status: BP stable throughout; O2 sats: transiently fell during during procedure Patient's Current Condition: stable Complications: No apparent complications Patient did tolerate procedure well. Chest X-ray ordered to verify placement.  CXR: pending.   Daniel Price 12/14/2018

## 2018-12-14 NOTE — Progress Notes (Addendum)
ANTICOAGULATION CONSULT NOTE - Follow Up Consult  Pharmacy Consult for heparin Indication: atrial fibrillation  No Known Allergies  Patient Measurements: Height: 6\' 2"  (188 cm) Weight: 183 lb 13.8 oz (83.4 kg) IBW/kg (Calculated) : 82.2 Heparin Dosing Weight: 83  Vital Signs: Temp: 99.3 F (37.4 C) (05/03 1631) Temp Source: Oral (05/03 1631) BP: 77/52 (05/03 1530) Pulse Rate: 125 (05/03 1530)  Labs: Recent Labs    12/16/2018 1630  01/08/2019 2235 12/13/18 0444 12/13/18 1023 12/13/18 1533 12/14/18 0341 12/14/18 1827  HGB 10.4*   < >  --  9.3*  --  10.9* 9.3*  --   HCT 38.3*   < >  --  33.1*  --  32.0* 31.8*  --   PLT 219  --   --  155  --   --  197  --   HEPARINUNFRC  --   --   --   --   --   --   --  <0.10*  CREATININE 1.58*  --   --  1.47*  --   --  1.41*  --   TROPONINI 0.09*  --  0.09* 0.09* 0.08*  --   --   --    < > = values in this interval not displayed.    Estimated Creatinine Clearance: 59.9 mL/min (A) (by C-G formula based on SCr of 1.41 mg/dL (H)).   Medical History: Past Medical History:  Diagnosis Date  . Anemia   . CHF (congestive heart failure) (HCC)   . Coronary artery disease   . History of SBE (subacute bacterial endocarditis)   . Hypercholesteremia   . Ischemic cardiomyopathy   . Myocardial infarction (HCC) 2006  . Polysubstance abuse (HCC)    Hx of  . Stroke (HCC)   . Transient ischemic attack, acute     Assessment: 3 yoM admitted with severe CHF started on inotropic support. Pt noted to have AFib this morning, pharmacy asked to start IV heparin. Pt received subcutaneous heparin this morning so will avoid bolus.  Heparin drip 1200 uts/hr running into peripheral IV  HL < 0.1 drawn from a-line.  No bleeding noted  However RN noted IV pulled out post turning and intubation - same time HL drawn -  Replaced now  Goal of Therapy:  Heparin level 0.3-0.7 units/ml Monitor platelets by anticoagulation protocol: Yes   Plan:  Continue Heparin  1200 units/hr with new IV and check in am  -Daily heparin level and CBC  Leota Sauers Pharm.D. CPP, BCPS Clinical Pharmacist [phone] 12/14/2018 7:43 PM

## 2018-12-14 NOTE — Progress Notes (Addendum)
eLink Physician-Brief Progress Note Patient Name: MARVENS BEAS DOB: Dec 16, 1950 MRN: [id number]   Date of Service  12/14/2018  HPI/Events of Note  Intubated at shift change.  Chest x-ray shows a endotracheal tube tip 8 cm above carina.   eICU Interventions  Recommend to advance ET tube by 4 cm.      Intervention Category Intermediate Interventions: Communication with other healthcare providers and/or family;Diagnostic test evaluation  Glory Rosebush 12/14/2018, 8:57 PM   1:23 AM Patient self extubated.  Respiratory rate was still less than 20, heart rate in 120s with sinus tachycardia.  ABG 30 minutes after self extubation showed a pH of 7.39, CO2 32, PO2 56 and bicarbonate 19.6.  We will continue to monitor on BiPAP.  ABG at 5 AM.  Potassium 3, ordered 30 mEq of IV potassium.  Plan notified to bedside nurse.

## 2018-12-14 NOTE — Progress Notes (Signed)
Progress Note  Patient Name: Daniel Price Date of Encounter: 12/14/2018  Primary Cardiologist: [facility]  Subjective   Central line placed yesterday. Initial co-ox 28%. Moved to ICU due to respiratory distress  Now on milrinone 0.25 & NE 10. Co-ox 48% -> 40%  More SOB this am and placed back on BIPAP. Remains on lasix gtt at 20. Urine output picking up ~350/hr . Weight stable. CVP 22 -> 6 (checked personally) Creatinine slightly improved. Denies CP.   Very tachycardic this am with periods of what looks like AF/AFL on tele  Echo performed. LVEF ok. MV thickened and appears stenotic. Mean gradient ~20. RV markedly dialted and severely HK.   Inpatient Medications    Scheduled Meds: . atorvastatin  40 mg Oral q1800  . clopidogrel  75 mg Oral QPM  . heparin  5,000 Units Subcutaneous Q8H  . pantoprazole  40 mg Oral QPM  . potassium chloride  40 mEq Oral Once   Continuous Infusions: . furosemide (LASIX) infusion 20 mg/hr (12/14/18 0601)  . milrinone 0.25 mcg/kg/min (12/14/18 0500)  . norepinephrine (LEVOPHED) Adult infusion 10 mcg/min (12/14/18 0500)   PRN Meds: acetaminophen **OR** acetaminophen, Ipratropium-Albuterol, promethazine, senna-docusate   Vital Signs    Vitals:   12/14/18 0730 12/14/18 0800 12/14/18 0830 12/14/18 0900  BP: 90/78 93/73 (!) 87/71 131/73  Pulse: (!) 132 (!) 127 (!) 139 (!) 150  Resp: 20 (!) Temp:      TempSrc:      SpO2: (!) 69% (!) 86% (!) 83% (!) 69%  Weight:      Height:        Intake/Output Summary (Last 24 hours) at 12/14/2018 0919 Last data filed at 12/14/2018 0500 Gross per 24 hour  Intake 1088.36 ml  Output 1725 ml  Net -636.64 ml   Last 3 Weights 12/14/2018 12/13/2018 12-23-2018  Weight (lbs) 183 lb 13.8 oz 182 lb 15.7 oz 183 lb  Weight (kg) 83.4 kg 83 kg 83.008 kg      Telemetry    Sinus tach with periods of possible AF/AFl into 140s Personally reviewed   Physical Exam   General:  Sitting up in bed on BIPAP  ill-appearing Tachypneic  HEENT: normal Neck: supple. no JVD. Carotids 2+ bilat; no bruits. No lymphadenopathy or thryomegaly appreciated. Cor: PMI nondisplaced. Tachy irregular rate & rhythm. 2/6 TR/MR + MS Lungs: clear Abdomen: soft, nontender, nondistended. No hepatosplenomegaly. No bruits or masses. Good bowel sounds. Extremities: no cyanosis, clubbing, rash, 4+ edema with  Neuro: alert & orientedx3, cranial nerves grossly intact. moves all 4 extremities w/o difficulty. Affect pleasant   Labs    Chemistry Recent Labs  Lab 23-Dec-2018 1630  12/13/18 0444 12/13/18 1533 12/14/18 0341  NA 141   < > 138 138 138  K 4.6   < > 3.9 4.1 3.4*  CL 108  --  107  --  106  CO2 14*  --  18*  --  20*  GLUCOSE 99  --  164*  --  130*  BUN 36*  --  38*  --  44*  CREATININE 1.58*  --  1.47*  --  1.41*  CALCIUM 9.0  --  8.8*  --  8.5*  PROT 6.4*  --   --   --   --   ALBUMIN 2.9*  --   --   --   --   AST 53*  --   --   --   --  ALT <5  --   --   --   --   ALKPHOS 84  --   --   --   --   BILITOT 2.2*  --   --   --   --   GFRNONAA 45*  --  49*  --  52*  GFRAA 52*  --  57*  --  60*  ANIONGAP 19*  --  13  --  12   < > = values in this interval not displayed.     Hematology Recent Labs  Lab 12/19/2018 1630  12/13/18 0444 12/13/18 1533 12/14/18 0341  WBC 26.5*  --  21.8*  --  28.9*  RBC 5.02  --  4.61  --  4.53  HGB 10.4*   < > 9.3* 10.9* 9.3*  HCT 38.3*   < > 33.1* 32.0* 31.8*  MCV 76.3*  --  71.8*  --  70.2*  MCH 20.7*  --  20.2*  --  20.5*  MCHC 27.2*  --  28.1*  --  29.2*  RDW 21.9*  --  21.2*  --  21.2*  PLT 219  --  155  --  197   < > = values in this interval not displayed.    Cardiac Enzymes Recent Labs  Lab 12/27/2018 1630 01/08/2019 2235 12/13/18 0444 12/13/18 1023  TROPONINI 0.09* 0.09* 0.09* 0.08*   No results for input(s): TROPIPOC in the last 168 hours.   BNP Recent Labs  Lab 12/26/2018 1630  BNP 1,859.8*     DDimer No results for input(s): DDIMER in the last 168  hours.   Radiology    Dg Chest Port 1 View  Result Date: 12/13/2018 CLINICAL DATA:  Central line placement EXAM: PORTABLE CHEST 1 VIEW COMPARISON:  12/13/2018 FINDINGS: Left internal jugular central line is been placed with the tip in the SVC. No pneumothorax. Cardiomegaly. Bilateral perihilar and lower lobe airspace opacities with moderate effusions, similar to prior study. IMPRESSION: Left central line placement with the tip in the SVC. No pneumothorax. Continued bilateral airspace disease and effusions, unchanged. Electronically Signed   By: Charlett NoseKevin  Dover M.D.   On: 12/13/2018 20:13   Dg Chest Port 1 View  Result Date: 12/13/2018 CLINICAL DATA:  Shortness of breath, labored breathing EXAM: PORTABLE CHEST 1 VIEW COMPARISON:  12/17/2018 FINDINGS: Small right pleural effusion. Bilateral interstitial and patchy alveolar airspace opacities. No pneumothorax. Stable cardiomegaly. Prior median sternotomy. No acute osseous abnormality. IMPRESSION: Mild pulmonary edema. Electronically Signed   By: Elige KoHetal  Patel   On: 12/13/2018 14:39   Dg Chest Port 1 View  Result Date: 12/23/2018 CLINICAL DATA:  Shortness of breath. EXAM: PORTABLE CHEST 1 VIEW COMPARISON:  01/14/2005 FINDINGS: There is a moderate right effusion and a small left effusion. There is bilateral interstitial pulmonary edema. Cardiomegaly. Pulmonary vascularity is slightly prominent. No acute bone abnormality. Median sternotomy. IMPRESSION: 1. Bilateral pulmonary edema with bilateral pleural effusions, right greater than left. 2. Cardiomegaly with mild pulmonary vascular congestion. Findings are consistent with congestive heart failure. Electronically Signed   By: Francene BoyersJames  Maxwell M.D.   On: 01/07/2019 16:47   [place] Ekg Site Rite  Result Date: 12/13/2018 If Site Rite image not attached, placement could not be confirmed due to current cardiac rhythm.  [place] Ekg Site Rite  Result Date: 12/13/2018 If Site Rite image not attached, placement could not be  confirmed due to current cardiac rhythm.   Patient Profile     68 y.o. male with PMH  of MV replacement, COPD, HLD, CVA and possible ICM who presented with 2 weeks onset of LE edema. Denies any CP. 3+ pitting edema bilaterally. He became hypotensive after IV diuresis.   Assessment & Plan    1. 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 LVEF 50%. MV thickened and appears stenotic. Mean gradient ~20. RV markedly dialted and severely HK.  - now on milrinone 0.25 and NE 10. Co-ox 40%.  - increase milrinone to 0.5. Follow co-ox and titrate NE as needed - CVP way down 22-> 6 (checked 3 times personally) but still with 4+ edema.Suspect we are exceeding cap refill rate.  - Continue lasix gtta t 20. May need to slow down - Placee UNNA boots  - Will need R/L cath once improved. May also need TEE to better assess MV.   2. Mitral stenosis - s/p bioprosthetic MV 2006 - On echo, peak and mean gradients are 30 and 22 mm Hg respectively. MVA by P 1/2 is 2.86 cm2 (probably inaccurate due to high fillling pressures) By continuity equation, MVA is 0.6 cm 2. - Will need to reassess at cath +/- TEE - With severe RV failure would not be candidate for re-do  3. Acute hypoxic respiratory failure - due to #1 - continue bipap as needed. - diurese  - sats not picking up on monitor. Previous ABG with paO2 > 300. Will replace arterial line later today - appreciate CCM input  4. Probable AF/AFL with RVR - start amio/heparin  5. Leukocytosis: WBC 26.5-> 28.9 on arrival. Unclear cause, afebrile - COVID negative - Will check BCX  6. Iron-deficiency anemia  - give feraheme  7. AKI - due to ATN. Improving with hemodynamic support  8. Hypokalemia - supp  CRITICAL CARE Performed by: Arvilla Meres  Total critical care time: 35 minutes  Critical care time was exclusive of separately billable procedures and treating other patients.  Critical  care was necessary to treat or prevent imminent or life-threatening deterioration.  Critical care was time spent personally by me (independent of midlevel providers or residents) on the following activities: development of treatment plan with patient and/or surrogate as well as nursing, discussions with consultants, evaluation of patient's response to treatment, examination of patient, obtaining history from patient or surrogate, ordering and performing treatments and interventions, ordering and review of laboratory studies, ordering and review of radiographic studies, pulse oximetry and re-evaluation of patient's condition.    For questions or updates, please contact [facility] Please consult [email or web address] for contact info under       Signed, Arvilla Meres, MD  12/14/2018, 9:19 AM

## 2018-12-14 NOTE — Progress Notes (Signed)
Contacted Cards fellow to notified of urine output of 0, bladder scan 237, still tachycardia and MAP's 65, max on Levo. Will stop Lasix drip and have HF reevaluate in the morning. Will repeat potassium lab once done with runs.

## 2018-12-14 NOTE — Progress Notes (Signed)
BLE wrapped with ACE bandages per Dr Gala Romney until [place] boots can be placed by ortho tech.

## 2018-12-14 NOTE — Progress Notes (Signed)
ANTICOAGULATION CONSULT NOTE - Initial Consult  Pharmacy Consult for heparin Indication: atrial fibrillation  No Known Allergies  Patient Measurements: Height: 6\' 2"  (188 cm) Weight: 183 lb 13.8 oz (83.4 kg) IBW/kg (Calculated) : 82.2 Heparin Dosing Weight: 83  Vital Signs: Temp: 98.5 F (36.9 C) (05/03 0000) Temp Source: Oral (05/03 0000) BP: 131/73 (05/03 0900) Pulse Rate: 150 (05/03 0900)  Labs: Recent Labs    01-02-2019 1630  2019-01-02 2235 12/13/18 0444 12/13/18 1023 12/13/18 1533 12/14/18 0341  HGB 10.4*   < >  --  9.3*  --  10.9* 9.3*  HCT 38.3*   < >  --  33.1*  --  32.0* 31.8*  PLT 219  --   --  155  --   --  197  CREATININE 1.58*  --   --  1.47*  --   --  1.41*  TROPONINI 0.09*  --  0.09* 0.09* 0.08*  --   --    < > = values in this interval not displayed.    Estimated Creatinine Clearance: 59.9 mL/min (A) (by C-G formula based on SCr of 1.41 mg/dL (H)).   Medical History: Past Medical History:  Diagnosis Date  . Anemia   . CHF (congestive heart failure) (HCC)   . Coronary artery disease   . History of SBE (subacute bacterial endocarditis)   . Hypercholesteremia   . Ischemic cardiomyopathy   . Myocardial infarction (HCC) 2006  . Polysubstance abuse (HCC)    Hx of  . Stroke (HCC)   . Transient ischemic attack, acute     Assessment: 71 yoM admitted with severe CHF started on inotropic support. Pt noted to have AFib this morning, pharmacy asked to start IV heparin. Pt received subcutaneous heparin this morning so will avoid bolus.   Goal of Therapy:  Heparin level 0.3-0.7 units/ml Monitor platelets by anticoagulation protocol: Yes   Plan:  -Heparin 1200 units/hr -Check 6hr heparin level -Daily heparin level and CBC  Fredonia Highland, PharmD, BCPS Clinical Pharmacist [phone] Please check AMION for all [facility] Pharmacy numbers 12/14/2018

## 2018-12-14 NOTE — Progress Notes (Signed)
Pt's breathing becoming more labored on bipap.  O2 sat remains unreadable.  Dr. Gala Romney notified and agreed that I should notify CCM for intubation.

## 2018-12-14 NOTE — Significant Event (Signed)
Rapid Response Event Note **late entry  Overview: Time called 1401 Time Ended 1530 RN called for extra hands, pt possible pulled out his central line  Initial Focused Assessment: On arrival pt lying 20 degree angle and trying to come out of bed. RN cleaning pt from where he attempted to pull out his Left central line. Pt in obvious respiratory distress. Dr. Gala Romney to bedside. Portable CXR showed pulmonary edema. SBP 80's  Interventions: Pt transported to 2H with Dr. Gala Romney and Danford Bad RN. Once there started on Bipap, 160 mg Lasix IVP, started on Milrinone and Levo gtt started. Pt began showing improvement with treatment.  Plan is to continue on Bipap with current treatment. Intubate if needed.    Event Summary: Name of Physician Notified: Chundi at (PTA RRT)  Name of Consulting Physician Notified: Coniglio at (PTA RRT)  Outcome: Stayed in room and stabalized  Event End Time: 0130  Gerarda Fraction

## 2018-12-14 NOTE — Progress Notes (Signed)
eLink Physician-Brief Progress Note Patient Name: Daniel Price DOB: May 02, 1951 MRN: [id number]   Date of Service  12/14/2018  HPI/Events of Note  Potassium 2.6 and ABG resulted.  eICU Interventions  Ordered 40 mEq of IV potassium replacement.  ABG in acceptable range.     Intervention Category Major Interventions: Respiratory failure - evaluation and management Intermediate Interventions: Communication with other healthcare providers and/or family;Electrolyte abnormality - evaluation and management;Medication change / dose adjustment  Glory Rosebush 12/14/2018, 8:39 PM

## 2018-12-14 NOTE — Consult Note (Addendum)
NAME:  Daniel Price, MRN:  [id number], DOB:  [birth year], LOS: 2 ADMISSION DATE:  12/19/2018, CONSULTATION DATE:  12/14/2018 REFERRING MD:  Dr. Gala Romney, Cardiology, CHIEF COMPLAINT:  Short of breath   Brief History   68 yr old male smoker presented with 3 days of dyspnea, wheeze, and leg swelling.  Treated for CHF exacerbation.  Required intubation for respiratory support.  Past Medical History  COPD, MV replacement, CHF, CVA, HTN, CAD, Substance abuse  Significant Hospital Events   5/01 Admit 5/03 Intubated  Consults:  Cardiology  Procedures:  Lt IJ CVL 5/02 >> ETT 5/03 >>  Significant Diagnostic Tests:  Echo 5/02 >> EF 45 to 50%, septal flattening, severe RV systolic dysfx, mod pericardial effusion  Micro Data:  COVID 5/01 >> negative Blood 5/03 >>  Antimicrobials:    Interim history/subjective:    Objective   Blood pressure (!) 77/52, pulse (!) 125, temperature 99.3 F (37.4 C), temperature source Oral, resp. rate (!) 21, height 6\' 2"  (1.88 m), weight 83.4 kg, SpO2 (!) 53 %. CVP:  [4 mmHg-13 mmHg] 4 mmHg      Intake/Output Summary (Last 24 hours) at 12/14/2018 1803 Last data filed at 12/14/2018 0945 Gross per 24 hour  Intake 1258.79 ml  Output 2375 ml  Net -1116.21 ml   Filed Weights   01/05/2019 2331 12/13/18 0432 12/14/18 0349  Weight: 83 kg 83 kg 83.4 kg    Examination:  General - alert Eyes - pupils reactive ENT - Bipap mask on Cardiac - irregular, tachycardic Chest - decreased BS, poor air movement Abdomen - soft, non tender, decreased BS Extremities - 3+ edema Skin - no rashes Neuro - follows simple commands   Resolved Hospital Problem list     Assessment & Plan:   Acute hypoxic respiratory failure. COPD. Plan - full vent support - f/u CXR, ABG - prn BDs  Acute on chronic diastolic CHF with RV failure. Cardiogenic shock. Mitral stenosis s/p bioprosthetic MV. A fib with RVR. Plan - milrinone, amiodarone, lasix, levophed - f/u CVP,  cooximetry  CKD 2. Hypokalemia. Plan - f/u BMET  Anemia of critical illness, iron deficiency and chronic disease. Plan - f/u CBC - transfuse for Hb < 7   Best practice:  Diet: tube feeds DVT prophylaxis: heparin gtt GI prophylaxis: protonix Mobility: bed rest Code Status: full code Disposition: ICU  Labs   CBC: Recent Labs  Lab 12/23/2018 1630 01/04/2019 2003 12/13/18 0444 12/13/18 1533 12/14/18 0341  WBC 26.5*  --  21.8*  --  28.9*  NEUTROABS 24.5*  --   --   --   --   HGB 10.4* 9.9* 9.3* 10.9* 9.3*  HCT 38.3* 29.0* 33.1* 32.0* 31.8*  MCV 76.3*  --  71.8*  --  70.2*  PLT 219  --  155  --  197    Basic Metabolic Panel: Recent Labs  Lab 01/10/2019 1630 01/08/2019 2003 12/13/18 0444 12/13/18 1533 12/14/18 0341  NA 141 138 138 138 138  K 4.6 3.8 3.9 4.1 3.4*  CL 108  --  107  --  106  CO2 14*  --  18*  --  20*  GLUCOSE 99  --  164*  --  130*  BUN 36*  --  38*  --  44*  CREATININE 1.58*  --  1.47*  --  1.41*  CALCIUM 9.0  --  8.8*  --  8.5*  MG  --   --  2.3  --  2.0  PHOS  --   --   --   --  4.0   GFR: Estimated Creatinine Clearance: 59.9 mL/min (A) (by C-G formula based on SCr of 1.41 mg/dL (H)). Recent Labs  Lab 12/25/2018 1630 12/13/18 0444 12/14/18 0341 12/14/18 0544 12/14/18 0820  WBC 26.5* 21.8* 28.9*  --   --   LATICACIDVEN  --   --   --  2.1* 2.0*    Liver Function Tests: Recent Labs  Lab 01/09/2019 1630  AST 53*  ALT <5  ALKPHOS 84  BILITOT 2.2*  PROT 6.4*  ALBUMIN 2.9*   No results for input(s): LIPASE, AMYLASE in the last 168 hours. No results for input(s): AMMONIA in the last 168 hours.  ABG    Component Value Date/Time   PHART 7.379 12/13/2018 1533   PCO2ART 30.1 (L) 12/13/2018 1533   PO2ART 305.0 (H) 12/13/2018 1533   HCO3 17.8 (L) 12/13/2018 1533   TCO2 19 (L) 12/13/2018 1533   ACIDBASEDEF 6.0 (H) 12/13/2018 1533   O2SAT 66.0 12/14/2018 1730     Coagulation Profile: No results for input(s): INR, PROTIME in the last 168  hours.  Cardiac Enzymes: Recent Labs  Lab 01/08/2019 1630 12/30/2018 2235 12/13/18 0444 12/13/18 1023  TROPONINI 0.09* 0.09* 0.09* 0.08*    HbA1C: No results found for: HGBA1C  CBG: No results for input(s): GLUCAP in the last 168 hours.  Review of Systems:   Unable to obtain.  Past Medical History  He,  has a past medical history of Anemia, CHF (congestive heart failure) (HCC), Coronary artery disease, History of SBE (subacute bacterial endocarditis), Hypercholesteremia, Ischemic cardiomyopathy, Myocardial infarction (HCC) (2006), Polysubstance abuse (HCC), Stroke (HCC), and Transient ischemic attack, acute.   Surgical History    Past Surgical History:  Procedure Laterality Date  . MITRAL VALVE REPLACEMENT       Social History   reports that he has been smoking cigarettes. He has been smoking about 0.50 packs per day. He has never used smokeless tobacco.   Family History   Unable to obtain.   Allergies No Known Allergies   Home Medications  Prior to Admission medications   Medication Sig Start Date End Date Taking? Authorizing Provider  Cholecalciferol (VITAMIN D3) 50 MCG (2000 UT) TABS Take 2,000 Units by mouth 2 (two) times a day.   Yes [provider]  clopidogrel (PLAVIX) 75 MG tablet Take 75 mg by mouth every evening.   Yes [provider]  galantamine (RAZADYNE) 8 MG tablet Take 8 mg by mouth daily.   Yes [provider]  lisinopril (ZESTRIL) 20 MG tablet Take 10 mg by mouth daily.   Yes [provider]  metoprolol tartrate (LOPRESSOR) 100 MG tablet Take 100 mg by mouth daily.   Yes [provider]  Omega-3 Fatty Acids (FISH OIL PO) Take 1 capsule by mouth daily.   Yes [provider]  pantoprazole (PROTONIX) 40 MG tablet Take 40 mg by mouth every evening.    Yes [provider]  simvastatin (ZOCOR) 80 MG tablet Take 80 mg by mouth every evening.   Yes [provider]     Critical care time:  34 minutes independent of procedure time.    D/w Dr. Jones BroomBensihmon  Coralyn HellingVineet Afsa Meany, MD [facility] 12/14/2018, 6:21 PM

## 2018-12-14 NOTE — Progress Notes (Signed)
Spoke with pt's sister Sahith Whitehall and updated her on her pt's status.  Deborah's phone number confirmed to be [phone].  Password set up.

## 2018-12-15 ENCOUNTER — Inpatient Hospital Stay (HOSPITAL_COMMUNITY): Payer: Medicare Other

## 2018-12-15 DIAGNOSIS — R6521 Severe sepsis with septic shock: Secondary | ICD-10-CM

## 2018-12-15 DIAGNOSIS — I480 Paroxysmal atrial fibrillation: Secondary | ICD-10-CM

## 2018-12-15 DIAGNOSIS — A419 Sepsis, unspecified organism: Secondary | ICD-10-CM

## 2018-12-15 DIAGNOSIS — I5043 Acute on chronic combined systolic (congestive) and diastolic (congestive) heart failure: Secondary | ICD-10-CM

## 2018-12-15 DIAGNOSIS — I509 Heart failure, unspecified: Secondary | ICD-10-CM

## 2018-12-15 DIAGNOSIS — R570 Cardiogenic shock: Secondary | ICD-10-CM

## 2018-12-15 DIAGNOSIS — R7881 Bacteremia: Secondary | ICD-10-CM

## 2018-12-15 LAB — BASIC METABOLIC PANEL
Anion gap: 11 (ref 5–15)
Anion gap: 16 — ABNORMAL HIGH (ref 5–15)
BUN: 50 mg/dL — ABNORMAL HIGH (ref 8–23)
BUN: 52 mg/dL — ABNORMAL HIGH (ref 8–23)
CO2: 18 mmol/L — ABNORMAL LOW (ref 22–32)
CO2: 14 mmol/L — ABNORMAL LOW (ref 22–32)
Calcium: 7.9 mg/dL — ABNORMAL LOW (ref 8.9–10.3)
Calcium: 7.2 mg/dL — ABNORMAL LOW (ref 8.9–10.3)
Chloride: 107 mmol/L (ref 98–111)
Chloride: 107 mmol/L (ref 98–111)
Creatinine, Ser: 1.64 mg/dL — ABNORMAL HIGH (ref 0.61–1.24)
Creatinine, Ser: 2.28 mg/dL — ABNORMAL HIGH (ref 0.61–1.24)
GFR calc Af Amer: 50 mL/min — ABNORMAL LOW (ref >60)
GFR calc Af Amer: 33 mL/min — ABNORMAL LOW (ref >60)
GFR calc non Af Amer: 43 mL/min — ABNORMAL LOW (ref >60)
GFR calc non Af Amer: 29 mL/min — ABNORMAL LOW (ref >60)
Glucose, Bld: 117 mg/dL — ABNORMAL HIGH (ref 70–99)
Glucose, Bld: 133 mg/dL — ABNORMAL HIGH (ref 70–99)
Potassium: 3.3 mmol/L — ABNORMAL LOW (ref 3.5–5.1)
Potassium: 3 mmol/L — ABNORMAL LOW (ref 3.5–5.1)
Sodium: 136 mmol/L (ref 135–145)
Sodium: 137 mmol/L (ref 135–145)

## 2018-12-15 LAB — POCT I-STAT 7, (LYTES, BLD GAS, ICA,H+H)
Acid-base deficit: 10 mmol/L — ABNORMAL HIGH (ref 0.0–2.0)
Acid-base deficit: 10 mmol/L — ABNORMAL HIGH (ref 0.0–2.0)
Acid-base deficit: 13 mmol/L — ABNORMAL HIGH (ref 0.0–2.0)
Acid-base deficit: 14 mmol/L — ABNORMAL HIGH (ref 0.0–2.0)
Acid-base deficit: 5 mmol/L — ABNORMAL HIGH (ref 0.0–2.0)
Acid-base deficit: 5 mmol/L — ABNORMAL HIGH (ref 0.0–2.0)
Bicarbonate: 13.2 mmol/L — ABNORMAL LOW (ref 20.0–28.0)
Bicarbonate: 13.6 mmol/L — ABNORMAL LOW (ref 20.0–28.0)
Bicarbonate: 16.3 mmol/L — ABNORMAL LOW (ref 20.0–28.0)
Bicarbonate: 16.8 mmol/L — ABNORMAL LOW (ref 20.0–28.0)
Bicarbonate: 18.6 mmol/L — ABNORMAL LOW (ref 20.0–28.0)
Bicarbonate: 19.6 mmol/L — ABNORMAL LOW (ref 20.0–28.0)
Calcium, Ion: 1.07 mmol/L — ABNORMAL LOW (ref 1.15–1.40)
Calcium, Ion: 1.07 mmol/L — ABNORMAL LOW (ref 1.15–1.40)
Calcium, Ion: 1.09 mmol/L — ABNORMAL LOW (ref 1.15–1.40)
Calcium, Ion: 1.14 mmol/L — ABNORMAL LOW (ref 1.15–1.40)
Calcium, Ion: 1.15 mmol/L (ref 1.15–1.40)
Calcium, Ion: 1.17 mmol/L (ref 1.15–1.40)
HCT: 27 % — ABNORMAL LOW (ref 39.0–52.0)
HCT: 28 % — ABNORMAL LOW (ref 39.0–52.0)
HCT: 29 % — ABNORMAL LOW (ref 39.0–52.0)
HCT: 29 % — ABNORMAL LOW (ref 39.0–52.0)
HCT: 30 % — ABNORMAL LOW (ref 39.0–52.0)
HCT: 31 % — ABNORMAL LOW (ref 39.0–52.0)
Hemoglobin: 10.2 g/dL — ABNORMAL LOW (ref 13.0–17.0)
Hemoglobin: 10.5 g/dL — ABNORMAL LOW (ref 13.0–17.0)
Hemoglobin: 9.2 g/dL — ABNORMAL LOW (ref 13.0–17.0)
Hemoglobin: 9.5 g/dL — ABNORMAL LOW (ref 13.0–17.0)
Hemoglobin: 9.9 g/dL — ABNORMAL LOW (ref 13.0–17.0)
Hemoglobin: 9.9 g/dL — ABNORMAL LOW (ref 13.0–17.0)
O2 Saturation: 100 %
O2 Saturation: 90 %
O2 Saturation: 92 %
O2 Saturation: 96 %
O2 Saturation: 96 %
O2 Saturation: 97 %
Patient temperature: 97.2
Patient temperature: 98.5
Patient temperature: 99.6
Potassium: 2.9 mmol/L — ABNORMAL LOW (ref 3.5–5.1)
Potassium: 2.9 mmol/L — ABNORMAL LOW (ref 3.5–5.1)
Potassium: 3 mmol/L — ABNORMAL LOW (ref 3.5–5.1)
Potassium: 3 mmol/L — ABNORMAL LOW (ref 3.5–5.1)
Potassium: 3.3 mmol/L — ABNORMAL LOW (ref 3.5–5.1)
Potassium: 3.4 mmol/L — ABNORMAL LOW (ref 3.5–5.1)
Sodium: 137 mmol/L (ref 135–145)
Sodium: 138 mmol/L (ref 135–145)
Sodium: 139 mmol/L (ref 135–145)
Sodium: 139 mmol/L (ref 135–145)
Sodium: 140 mmol/L (ref 135–145)
Sodium: 140 mmol/L (ref 135–145)
TCO2: 14 mmol/L — ABNORMAL LOW (ref 22–32)
TCO2: 15 mmol/L — ABNORMAL LOW (ref 22–32)
TCO2: 17 mmol/L — ABNORMAL LOW (ref 22–32)
TCO2: 18 mmol/L — ABNORMAL LOW (ref 22–32)
TCO2: 20 mmol/L — ABNORMAL LOW (ref 22–32)
TCO2: 21 mmol/L — ABNORMAL LOW (ref 22–32)
pCO2 arterial: 30.6 mmHg — ABNORMAL LOW (ref 32.0–48.0)
pCO2 arterial: 32 mmHg (ref 32.0–48.0)
pCO2 arterial: 33.5 mmHg (ref 32.0–48.0)
pCO2 arterial: 35.2 mmHg (ref 32.0–48.0)
pCO2 arterial: 37 mmHg (ref 32.0–48.0)
pCO2 arterial: 42.7 mmHg (ref 32.0–48.0)
pH, Arterial: 7.196 — CL (ref 7.350–7.450)
pH, Arterial: 7.203 — ABNORMAL LOW (ref 7.350–7.450)
pH, Arterial: 7.206 — ABNORMAL LOW (ref 7.350–7.450)
pH, Arterial: 7.252 — ABNORMAL LOW (ref 7.350–7.450)
pH, Arterial: 7.392 (ref 7.350–7.450)
pH, Arterial: 7.392 (ref 7.350–7.450)
pO2, Arterial: 100 mmHg (ref 83.0–108.0)
pO2, Arterial: 115 mmHg — ABNORMAL HIGH (ref 83.0–108.0)
pO2, Arterial: 247 mmHg — ABNORMAL HIGH (ref 83.0–108.0)
pO2, Arterial: 56 mmHg — ABNORMAL LOW (ref 83.0–108.0)
pO2, Arterial: 80 mmHg — ABNORMAL LOW (ref 83.0–108.0)
pO2, Arterial: 82 mmHg — ABNORMAL LOW (ref 83.0–108.0)

## 2018-12-15 LAB — BLOOD CULTURE ID PANEL (REFLEXED)

## 2018-12-15 LAB — GLUCOSE, CAPILLARY
Glucose-Capillary: 109 mg/dL — ABNORMAL HIGH (ref 70–99)
Glucose-Capillary: 119 mg/dL — ABNORMAL HIGH (ref 70–99)
Glucose-Capillary: 125 mg/dL — ABNORMAL HIGH (ref 70–99)
Glucose-Capillary: 134 mg/dL — ABNORMAL HIGH (ref 70–99)
Glucose-Capillary: 138 mg/dL — ABNORMAL HIGH (ref 70–99)
Glucose-Capillary: 174 mg/dL — ABNORMAL HIGH (ref 70–99)

## 2018-12-15 LAB — CBC
HCT: 30.7 % — ABNORMAL LOW (ref 39.0–52.0)
Hemoglobin: 9.1 g/dL — ABNORMAL LOW (ref 13.0–17.0)
MCH: 20.6 pg — ABNORMAL LOW (ref 26.0–34.0)
MCHC: 29.6 g/dL — ABNORMAL LOW (ref 30.0–36.0)
MCV: 69.6 fL — ABNORMAL LOW (ref 80.0–100.0)
Platelets: 182 10*3/uL (ref 150–400)
RBC: 4.41 MIL/uL (ref 4.22–5.81)
RDW: 20.6 % — ABNORMAL HIGH (ref 11.5–15.5)
WBC: 7.9 10*3/uL (ref 4.0–10.5)
nRBC: 9.1 % — ABNORMAL HIGH (ref 0.0–0.2)

## 2018-12-15 LAB — COOXEMETRY PANEL
Carboxyhemoglobin: 1.8 % — ABNORMAL HIGH (ref 0.5–1.5)
Methemoglobin: 1.4 % (ref 0.0–1.5)
O2 Saturation: 65.9 %
Total hemoglobin: 9.5 g/dL — ABNORMAL LOW (ref 12.0–16.0)

## 2018-12-15 LAB — MAGNESIUM
Magnesium: 1.8 mg/dL (ref 1.7–2.4)
Magnesium: 2.3 mg/dL (ref 1.7–2.4)

## 2018-12-15 LAB — HEPARIN LEVEL (UNFRACTIONATED)
Heparin Unfractionated: 0.39 IU/mL (ref 0.30–0.70)
Heparin Unfractionated: 0.43 IU/mL (ref 0.30–0.70)

## 2018-12-15 LAB — PHOSPHORUS
Phosphorus: 3.8 mg/dL (ref 2.5–4.6)
Phosphorus: 3.9 mg/dL (ref 2.5–4.6)

## 2018-12-15 MED ORDER — EPINEPHRINE PF 1 MG/ML IJ SOLN
0.5000 ug/min | INTRAVENOUS | Status: DC
  Administered 2018-12-15 (×3): 20 ug/min via INTRAVENOUS
  Administered 2018-12-15: 5 ug/min via INTRAVENOUS
  Administered 2018-12-16 (×6): 20 ug/min via INTRAVENOUS
  Administered 2018-12-17: 5.333 ug/min via INTRAVENOUS
  Administered 2018-12-17 – 2018-12-18 (×8): 20 ug/min via INTRAVENOUS
  Administered 2018-12-18: 25 ug/min via INTRAVENOUS
  Filled 2018-12-15 (×22): qty 4

## 2018-12-15 MED ORDER — PHENYLEPHRINE 40 MCG/ML (10ML) SYRINGE FOR IV PUSH (FOR BLOOD PRESSURE SUPPORT)
80.0000 ug | PREFILLED_SYRINGE | Freq: Once | INTRAVENOUS | Status: AC
  Administered 2018-12-15: 80 ug via INTRAVENOUS

## 2018-12-15 MED ORDER — PHENYLEPHRINE HCL-NACL 10-0.9 MG/250ML-% IV SOLN
0.0000 ug/min | INTRAVENOUS | Status: DC
  Filled 2018-12-15: qty 250

## 2018-12-15 MED ORDER — MIDAZOLAM HCL 2 MG/2ML IJ SOLN
2.0000 mg | Freq: Once | INTRAMUSCULAR | Status: AC
  Administered 2018-12-15: 2 mg via INTRAVENOUS

## 2018-12-15 MED ORDER — VANCOMYCIN HCL 10 G IV SOLR
1500.0000 mg | INTRAVENOUS | Status: DC
  Filled 2018-12-15 (×2): qty 1500

## 2018-12-15 MED ORDER — MAGNESIUM SULFATE 2 GM/50ML IV SOLN
2.0000 g | Freq: Once | INTRAVENOUS | Status: AC
  Administered 2018-12-15: 2 g via INTRAVENOUS
  Filled 2018-12-15: qty 50

## 2018-12-15 MED ORDER — VASOPRESSIN 20 UNIT/ML IV SOLN
0.0400 [IU]/min | INTRAVENOUS | Status: DC
  Administered 2018-12-15 – 2018-12-16 (×2): 0.03 [IU]/min via INTRAVENOUS
  Administered 2018-12-17 (×2): 0.04 [IU]/min via INTRAVENOUS
  Filled 2018-12-15 (×4): qty 2

## 2018-12-15 MED ORDER — SODIUM CHLORIDE 0.9 % IV SOLN
2.0000 g | INTRAVENOUS | Status: DC
  Administered 2018-12-15: 2 g via INTRAVENOUS
  Filled 2018-12-15 (×2): qty 20

## 2018-12-15 MED ORDER — ROCURONIUM BROMIDE 50 MG/5ML IV SOLN
80.0000 mg | Freq: Once | INTRAVENOUS | Status: AC
  Administered 2018-12-15: 80 mg via INTRAVENOUS

## 2018-12-15 MED ORDER — EPINEPHRINE 1 MG/10ML IJ SOSY
1.0000 mg | PREFILLED_SYRINGE | Freq: Once | INTRAMUSCULAR | Status: AC
  Administered 2018-12-15: 1 mg via INTRAVENOUS

## 2018-12-15 MED ORDER — PIPERACILLIN-TAZOBACTAM 3.375 G IVPB
3.3750 g | Freq: Three times a day (TID) | INTRAVENOUS | Status: DC
  Administered 2018-12-15 – 2018-12-18 (×9): 3.375 g via INTRAVENOUS
  Filled 2018-12-15 (×8): qty 50

## 2018-12-15 MED ORDER — POTASSIUM CHLORIDE 20 MEQ/15ML (10%) PO SOLN
40.0000 meq | ORAL | Status: AC
  Administered 2018-12-15 (×2): 40 meq via ORAL
  Filled 2018-12-15 (×2): qty 30

## 2018-12-15 MED ORDER — PHENYLEPHRINE 40 MCG/ML (10ML) SYRINGE FOR IV PUSH (FOR BLOOD PRESSURE SUPPORT)
80.0000 ug | PREFILLED_SYRINGE | Freq: Once | INTRAVENOUS | Status: AC
  Administered 2018-12-15: 80 ug via INTRAVENOUS
  Filled 2018-12-15: qty 10

## 2018-12-15 MED ORDER — POTASSIUM CHLORIDE 10 MEQ/50ML IV SOLN
10.0000 meq | INTRAVENOUS | Status: AC
  Administered 2018-12-15 (×3): 10 meq via INTRAVENOUS
  Filled 2018-12-15: qty 50

## 2018-12-15 MED ORDER — VANCOMYCIN HCL 10 G IV SOLR
2000.0000 mg | Freq: Once | INTRAVENOUS | Status: AC
  Administered 2018-12-15: 2000 mg via INTRAVENOUS
  Filled 2018-12-15: qty 2000

## 2018-12-15 MED ORDER — ETOMIDATE 2 MG/ML IV SOLN
20.0000 mg | Freq: Once | INTRAVENOUS | Status: AC
  Administered 2018-12-15: 20 mg via INTRAVENOUS

## 2018-12-15 MED ORDER — SODIUM CHLORIDE 0.9 % IV BOLUS
1000.0000 mL | Freq: Once | INTRAVENOUS | Status: AC
  Administered 2018-12-15: 1000 mL via INTRAVENOUS

## 2018-12-15 MED ORDER — FENTANYL CITRATE (PF) 100 MCG/2ML IJ SOLN
50.0000 ug | Freq: Once | INTRAMUSCULAR | Status: AC
  Administered 2018-12-15: 08:00:00 50 ug via INTRAVENOUS

## 2018-12-15 NOTE — Progress Notes (Signed)
Pt extubated himself, pt was bagged and bipap applied at 100%, O2 96%, Elink and MD camera in, will check ABG in , continue to monitor patient and contact MD with results.

## 2018-12-15 NOTE — CV Procedure (Signed)
Radial arterial line placement.    Emergent consent assumed. The right wrist was prepped and draped in the routine sterile fashion a single lumen radial arterial catheter was placed in the right radial artery using a modified Seldinger technique and u/s guidance. Good blood flow and wave forms. A dressing was placed.     Arvilla Meres, MD  8:49 AM

## 2018-12-15 NOTE — Procedures (Signed)
Intubation Procedure Note GLYN GERADS [id number] [birth year]  Procedure: Intubation Indications: Respiratory insufficiency  Procedure Details Consent: Risks of procedure as well as the alternatives and risks of each were explained to the (patient/caregiver).  Consent for procedure obtained. Time Out: Verified patient identification, verified procedure, site/side was marked, verified correct patient position, special equipment/implants available, medications/allergies/relevent history reviewed, required imaging and test results available.  Performed  Maximum sterile technique was used including gloves, hand hygiene and mask.  MAC 3  Medications: Versed 2 mg, Fentanyl 50 mcg, Etomidate 20 mg, Roc 80 mg ETT visualized passing between vocal cords. 26cm at lip  Evaluation Hemodynamic Status: Persistent hypotension treated with pressors and fluid; O2 sats: unable to monitor due to poor waveform Patient's Current Condition: unstable Complications: No apparent complications Patient did tolerate procedure well. Chest X-ray ordered to verify placement.  CXR: pending.   Revonda Menter Rodman Pickle 12/15/2018

## 2018-12-15 NOTE — Progress Notes (Signed)
NAME:  Daniel Price, MRN:  [id number], DOB:  [birth year], LOS: 3 ADMISSION DATE:  12-15-2018, CONSULTATION DATE:  12/14/2018 REFERRING MD:  Dr. Gala Romney, Cardiology, CHIEF COMPLAINT:  Short of breath   Brief History   68 yr old male smoker presented with 3 days of dyspnea, wheeze, and leg swelling.  Treated for CHF exacerbation.  Required intubation for respiratory support.  Past Medical History  COPD, MV replacement, CHF, CVA, HTN, CAD, Substance abuse  Significant Hospital Events   5/01 Admit 5/03 Intubated  Consults:  Cardiology  Procedures:  Lt IJ CVL 5/02 >> ETT 5/03 >>  Significant Diagnostic Tests:  Echo 5/02 >> EF 45 to 50%, septal flattening, severe RV systolic dysfx, mod pericardial effusion  Micro Data:  COVID 5/01 >> negative BCx 5/03 >> GNR, pending Trach asp 5/4  Antimicrobials:  Ceftriaxone 5/4> Vanc 5/4 Zosyn 5/4  Interim history/subjective:  Self-extubated overnight. Does not recall doing this. Feels short of breath and tired.  Objective   Blood pressure (!) 138/97, pulse (!) 139, temperature 99.6 F (37.6 C), temperature source Axillary, resp. rate (!) 31, height  (1.88 m), weight 83.4 kg, SpO2 99 %. CVP:  [4 mmHg-89 mmHg] 89 mmHg  Vent Mode: BIPAP FiO2 (%):  [60 %-100 %] 80 % Set Rate:  [12 bmp-18 bmp] 12 bmp Vt Set:  [620 mL-650 mL] 650 mL PEEP:  [5 cmH20] 5 cmH20 Plateau Pressure:  [17 cmH20] 17 cmH20   Intake/Output Summary (Last 24 hours) at 12/15/2018 0807 Last data filed at 12/15/2018 0600 Gross per 24 hour  Intake 1769.97 ml  Output 1725 ml  Net 44.97 ml   Filed Weights   2018/12/15 2331 12/13/18 0432 12/14/18 0349  Weight: 83 kg 83 kg 83.4 kg   Physical Exam: General: Thin-appearing male, respiratory distress HENT: [place], AT, BiPAP in place Eyes: EOMI, no scleral icterus Respiratory: Diminished breath sounds bilaterally, minimal air movement. Accessory muscle use Cardiovascular: tachycardic, -M/R/G, no JVD GI: BS+, soft,  nontender Extremities:2+ pitting edema,-tenderness Neuro: Awake and alert, follows commands GU: Condom cath in place  CXR 5/3 - Bilateral pleural effusions R significantly >L. ETT at 8cm. L IJ CVC in place. Sternal wires  Resolved Hospital Problem list     Assessment & Plan:   Acute hypoxic respiratory failure. COPD. COVID-19 negative Plan - Reintubate - Full vent support - ABG in 1 hour - Diureses per Cardiology - Scheduled BD  Septic Shock secondary GNR bacteremia. Plan - Broaden antibiotics to Vanc and Zosyn - IVF bolus - Pressor support for MAP goal >65 - F/u final cultures - Repeat BCx for clearance  Acute on chronic diastolic CHF with RV failure. Cardiogenic shock. Mitral stenosis s/p bioprosthetic MV. A fib with RVR. Plan - Amiodarone for AFRVR - Inotropic and pressor support  - Add vasopressin - F/u CVP, cooximetry  CKD 2. Hypokalemia. Plan - Daily BMET - Replete K  Anemia of critical illness, iron deficiency and chronic disease. Plan - f/u CBC - transfuse for Hb < 7   Best practice:  Diet: NPO DVT prophylaxis: heparin gtt GI prophylaxis: protonix Mobility: bed rest Code Status: full code Disposition: ICU  Labs   CBC: Recent Labs  Lab 15-Dec-2018 1630  12/13/18 0444  12/14/18 0341 12/14/18 2024 12/15/18 0040 12/15/18 0500 12/15/18 0529  WBC 26.5*  --  21.8*  --  28.9*  --   --  7.9  --   NEUTROABS 24.5*  --   --   --   --   --   --   --   --  HGB 10.4*   < > 9.3*   < > 9.3* 10.5* 10.2* 9.1* 10.5*  HCT 38.3*   < > 33.1*   < > 31.8* 31.0* 30.0* 30.7* 31.0*  MCV 76.3*  --  71.8*  --  70.2*  --   --  69.6*  --   PLT 219  --  155  --  197  --   --  182  --    < > = values in this interval not displayed.    Basic Metabolic Panel: Recent Labs  Lab 12/17/2018 1630  12/13/18 0444  12/14/18 0341 12/14/18 2024 12/14/18 2035 12/15/18 0040 12/15/18 0500 12/15/18 0529  NA 141   < > 138   < > 138 138 136 137 136 139  K 4.6   < > 3.9   <  > 3.4* 2.6* 2.6* 3.0* 3.3* 3.4*  CL 108  --  107  --  106  --  104  --  107  --   CO2 14*  --  18*  --  20*  --  19*  --  18*  --   GLUCOSE 99  --  164*  --  130*  --  165*  --  117*  --   BUN 36*  --  38*  --  44*  --  46*  --  50*  --   CREATININE 1.58*  --  1.47*  --  1.41*  --  1.58*  --  1.64*  --   CALCIUM 9.0  --  8.8*  --  8.5*  --  8.2*  --  7.9*  --   MG  --   --  2.3  --  2.0  --  2.1  --  1.8  --   PHOS  --   --   --   --  4.0  --  4.8*  --  3.8  --    < > = values in this interval not displayed.   GFR: Estimated Creatinine Clearance: 51.5 mL/min (A) (by C-G formula based on SCr of 1.64 mg/dL (H)). Recent Labs  Lab 12/27/2018 1630 12/13/18 0444 12/14/18 0341 12/14/18 0544 12/14/18 0820 12/15/18 0500  WBC 26.5* 21.8* 28.9*  --   --  7.9  LATICACIDVEN  --   --   --  2.1* 2.0*  --     Liver Function Tests: Recent Labs  Lab 12/21/2018 1630  AST 53*  ALT <5  ALKPHOS 84  BILITOT 2.2*  PROT 6.4*  ALBUMIN 2.9*   No results for input(s): LIPASE, AMYLASE in the last 168 hours. No results for input(s): AMMONIA in the last 168 hours.  ABG    Component Value Date/Time   PHART 7.392 12/15/2018 0529   PCO2ART 30.6 (L) 12/15/2018 0529   PO2ART 82.0 (L) 12/15/2018 0529   HCO3 18.6 (L) 12/15/2018 0529   TCO2 20 (L) 12/15/2018 0529   ACIDBASEDEF 5.0 (H) 12/15/2018 0529   O2SAT 96.0 12/15/2018 0529     Coagulation Profile: No results for input(s): INR, PROTIME in the last 168 hours.  Cardiac Enzymes: Recent Labs  Lab 01/02/2019 1630 12/16/2018 2235 12/13/18 0444 12/13/18 1023  TROPONINI 0.09* 0.09* 0.09* 0.08*    HbA1C: No results found for: HGBA1C  CBG: Recent Labs  Lab 12/14/18 2040 12/15/18 0016 12/15/18 0740  GLUCAP 167* 174* 119*    Critical care time: 38 min    The patient is critically ill with multiple organ systems failure  and requires high complexity decision making for assessment and support, frequent evaluation and titration of therapies,  application of advanced monitoring technologies and extensive interpretation of multiple databases.   Critical Care Time devoted to patient care services described in this note is 38 Minutes. This time reflects time of care of this signee Dr. Mechele CollinJane Ellison. This critical care time does not reflect procedure time, or teaching time or supervisory time of PA/NP/Med student/Med Resident etc but could involve care discussion time.  D/w Dr. Annita BrodBensimhon  Jane Ellison, M.D. [facility] Pager: [phone] After hours pager: [phone]

## 2018-12-15 NOTE — Progress Notes (Signed)
RT arrived in patients room with multiple RN's and Shannon RT around bed, pt had self extubated. Pt had been on BIPAP previous day, so pt was placed back on BIPAP until MD could camera in. MD gave ok for BIPAP and ordered ABG.

## 2018-12-15 NOTE — Progress Notes (Signed)
Progress Note  Patient Name: Daniel Price Date of Encounter: 12/15/2018  Primary Cardiologist: [place] line placed Initial co-ox 28%. Moved to ICU due to respiratory distress  Intubated last night but self-extubated. Has been in AF overnight. This am markedly hypotensive and septic with GNR in blood.   Started on NE and vasopressin with minimal effect. Epi added. Intubated emergently by CCM with me at the bedside. I then performed emergent DC-CV for AF with rates ~170-190. Now in sinus tach at 140. SBP in 70s on vasopressin, NE 100 and epi 5  Echo performed. LVEF ok. MV thickened and appears stenotic. Mean gradient ~20. RV markedly dialted and severely HK.   Inpatient Medications    Scheduled Meds:  atorvastatin  40 mg Per Tube q1800   chlorhexidine gluconate (MEDLINE KIT)  15 mL Mouth Rinse BID   clopidogrel  75 mg Per Tube QPM   feeding supplement (PRO-STAT SUGAR FREE 64)  30 mL Per Tube BID   feeding supplement (VITAL HIGH PROTEIN)  1,000 mL Per Tube Q24H   fentaNYL (SUBLIMAZE) injection  50 mcg Intravenous Once   mouth rinse  15 mL Mouth Rinse 10 times per day   pantoprazole sodium  40 mg Per Tube Q24H   Continuous Infusions:  amiodarone 30 mg/hr (12/15/18 0126)   fentaNYL infusion INTRAVENOUS 100 mcg/hr (12/15/18 0000)   heparin 1,200 Units/hr (12/15/18 0626)   milrinone 0.5 mcg/kg/min (12/15/18 0627)   norepinephrine (LEVOPHED) Adult infusion 33 mcg/min (12/15/18 0143)   piperacillin-tazobactam (ZOSYN)  IV     vancomycin     vasopressin (PITRESSIN) infusion - *FOR SHOCK* 0.03 Units/min (12/15/18 0832)   PRN Meds: acetaminophen **OR** acetaminophen, bisacodyl, docusate, fentaNYL, ipratropium-albuterol, midazolam, promethazine, senna-docusate   Vital Signs    Vitals:   12/15/18 0500 12/15/18 0600 12/15/18 0700 12/15/18 0742  BP:  (!) 83/71 (!) 138/97   Pulse: (!) 123 (!) 133 (!) 139   Resp: (!) 24 (!) 21 (!) 31   Temp:     99.6 F (37.6 C)  TempSrc:    Axillary  SpO2: 100% 100% 99%   Weight:      Height:        Intake/Output Summary (Last 24 hours) at 12/15/2018 0851 Last data filed at 12/15/2018 0600 Gross per 24 hour  Intake 1769.97 ml  Output 1725 ml  Net 44.97 ml   Last 3 Weights 12/14/2018 12/13/2018 12/29/2018  Weight (lbs) 183 lb 13.8 oz 182 lb 15.7 oz 183 lb  Weight (kg) 83.4 kg 83 kg 83.008 kg      Telemetry    AF 170-190 -> Sinus tach 140s Personally reviewed   Physical Exam   General:  Intubated critically ill appearing   HEENT: normal + ETT + temporal wasting  Neck: supple. JVP 10 Carotids 2+ bilat; no bruits. No lymphadenopathy or thryomegaly appreciated. Cor: PMI laterally displaced. Tachy regular Lungs: tachypneic + rhonchi  Abdomen: soft, nontender, nondistended. No hepatosplenomegaly. No bruits or masses. Good bowel sounds. Extremities: no cyanosis, clubbing, rash, 2-3+ edema Wrapped  Neuro: alert & orientedx3, cranial nerves grossly intact. moves all 4 extremities w/o difficulty.   Labs    Chemistry Recent Labs  Lab 12/28/2018 1630  12/14/18 0341  12/14/18 2035 12/15/18 0040 12/15/18 0500 12/15/18 0529  NA 141   < > 138   < > 136 137 136 139  K 4.6   < > 3.4*   < > 2.6* 3.0* 3.3* 3.4*  CL 108   < > 106  --  104  --  107  --   CO2 14*   < > 20*  --  19*  --  18*  --   GLUCOSE 99   < > 130*  --  165*  --  117*  --   BUN 36*   < > 44*  --  46*  --  50*  --   CREATININE 1.58*   < > 1.41*  --  1.58*  --  1.64*  --   CALCIUM 9.0   < > 8.5*  --  8.2*  --  7.9*  --   PROT 6.4*  --   --   --   --   --   --   --   ALBUMIN 2.9*  --   --   --   --   --   --   --   AST 53*  --   --   --   --   --   --   --   ALT <5  --   --   --   --   --   --   --   ALKPHOS 84  --   --   --   --   --   --   --   BILITOT 2.2*  --   --   --   --   --   --   --   GFRNONAA 45*   < > 52*  --  45*  --  43*  --   GFRAA 52*   < > 60*  --  52*  --  50*  --   ANIONGAP 19*   < > 12  --  13  --  11  --      < > = values in this interval not displayed.     Hematology Recent Labs  Lab 12/13/18 0444  12/14/18 0341  12/15/18 0040 12/15/18 0500 12/15/18 0529  WBC 21.8*  --  28.9*  --   --  7.9  --   RBC 4.61  --  4.53  --   --  4.41  --   HGB 9.3*   < > 9.3*   < > 10.2* 9.1* 10.5*  HCT 33.1*   < > 31.8*   < > 30.0* 30.7* 31.0*  MCV 71.8*  --  70.2*  --   --  69.6*  --   MCH 20.2*  --  20.5*  --   --  20.6*  --   MCHC 28.1*  --  29.2*  --   --  29.6*  --   RDW 21.2*  --  21.2*  --   --  20.6*  --   PLT 155  --  197  --   --  182  --    < > = values in this interval not displayed.    Cardiac Enzymes Recent Labs  Lab 12/26/2018 1630 12/21/2018 2235 12/13/18 0444 12/13/18 1023  TROPONINI 0.09* 0.09* 0.09* 0.08*   No results for input(s): TROPIPOC in the last 168 hours.   BNP Recent Labs  Lab 01/04/2019 1630  BNP 1,859.8*     DDimer No results for input(s): DDIMER in the last 168 hours.   Radiology    Dg Chest Port 1 View  Result Date: 12/14/2018 CLINICAL DATA:  ET tube EXAM: PORTABLE CHEST 1 VIEW COMPARISON:  12/13/2018 FINDINGS: Moderate  to large right pleural effusion and small left pleural effusion. Bilateral airspace disease, right greater than left. There is cardiomegaly. Left central line remains in place, unchanged. Interval placement endotracheal tube with the tip 8 cm above the carina. NG tube enters the stomach. IMPRESSION: Endotracheal tube 8 cm above the carina. Bilateral effusions and airspace disease, right greater than left, similar to prior study. Electronically Signed   By: Rolm Baptise M.D.   On: 12/14/2018 19:05   Dg Chest Port 1 View  Result Date: 12/13/2018 CLINICAL DATA:  Central line placement EXAM: PORTABLE CHEST 1 VIEW COMPARISON:  12/13/2018 FINDINGS: Left internal jugular central line is been placed with the tip in the SVC. No pneumothorax. Cardiomegaly. Bilateral perihilar and lower lobe airspace opacities with moderate effusions, similar to prior study.  IMPRESSION: Left central line placement with the tip in the SVC. No pneumothorax. Continued bilateral airspace disease and effusions, unchanged. Electronically Signed   By: Rolm Baptise M.D.   On: 12/13/2018 20:13   Dg Chest Port 1 View  Result Date: 12/13/2018 CLINICAL DATA:  Shortness of breath, labored breathing EXAM: PORTABLE CHEST 1 VIEW COMPARISON:  12/23/2018 FINDINGS: Small right pleural effusion. Bilateral interstitial and patchy alveolar airspace opacities. No pneumothorax. Stable cardiomegaly. Prior median sternotomy. No acute osseous abnormality. IMPRESSION: Mild pulmonary edema. Electronically Signed   By: Kathreen Devoid   On: 12/13/2018 14:39   [place] Ekg Site Rite  Result Date: 12/13/2018 If Site Rite image not attached, placement could not be confirmed due to current cardiac rhythm.  [place] Ekg Site Rite  Result Date: 12/13/2018 If Site Rite image not attached, placement could not be confirmed due to current cardiac rhythm.   Patient Profile     68 y.o. male with PMH of MV replacement, COPD, HLD, CVA and possible ICM who presented with 2 weeks onset of LE edema. Denies any CP. 3+ pitting edema bilaterally. He became hypotensive after IV diuresis.   Assessment & Plan    1. 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 LVEF 50%. MV thickened and appears stenotic. Mean gradient ~20. RV markedly dialted and severely HK.  - earlier this am on milrinone 0.5 and NE 10. Co-ox 40% -> 66% but now septic   - CVP down but have given more IVF for shock  - stop lasix  - given comorbid conditions and severe RV failure doubt he will be candidate for re-do MVR thus long-term options poo  2. Mitral stenosis - s/p bioprosthetic MV 2006 - On echo, peak and mean gradients are 30 and 22 mm Hg respectively. MVA by P 1/2 is 2.86 cm2 (probably inaccurate due to high fillling pressures) By continuity equation, MVA is 0.6 cm 2. - If improves, will need to  reassess at cath +/- TEE - With severe RV failure would not be candidate for re-do  3. GNR sepsis with septic shock - speciations pending - cover with broad spectrum abx. Will eventually need lines replaced - high-dose pressors - at high-risk for endocarditis with MVR  4. Acute hypoxic respiratory failure - due to #1&#3 - now intubated - appreciate CCM care  5. Probable AF/AFL with RVR -s/p DC-CV today. continue amio/heparin  6. AKI - due to ATN/shock. Improving with hemodynamic support  I called his sister and discussed the situation and conveyed that I did not think he would survive. Will remain full code for now. Will update further throughout the day.   CRITICAL  CARE Performed by: Glori Bickers  Total critical care time: 60 minutes  Critical care time was exclusive of separately billable procedures and treating other patients.  Critical care was necessary to treat or prevent imminent or life-threatening deterioration.  Critical care was time spent personally by me (independent of midlevel providers or residents) on the following activities: development of treatment plan with patient and/or surrogate as well as nursing, discussions with consultants, evaluation of patient's response to treatment, examination of patient, obtaining history from patient or surrogate, ordering and performing treatments and interventions, ordering and review of laboratory studies, ordering and review of radiographic studies, pulse oximetry and re-evaluation of patient's condition.    For questions or updates, please contact [place] Please consult [email or web address] for contact info under       Signed, Glori Bickers, MD  12/15/2018, 8:51 AM

## 2018-12-15 NOTE — Progress Notes (Signed)
eLink Physician-Brief Progress Note Patient Name: Daniel Price DOB: 07-24-1951 MRN: [id number]   Date of Service  12/15/2018  HPI/Events of Note  Blood culture 3 out of 4 showing gram-negative rods.  eICU Interventions  Ordered ceftriaxone, adjust antibiotics based on the final culture and sensitivity     Intervention Category Intermediate Interventions: Infection - evaluation and management;Diagnostic test evaluation;Medication change / dose adjustment  Jae Dire Natasha Paulson 12/15/2018, 5:05 AM

## 2018-12-15 NOTE — CV Procedure (Signed)
    DIRECT CURRENT CARDIOVERSION  NAME:  Daniel Price   MRN: [id number] DOB:  22-May-1951   ADMIT DATE: 12/13/2018   INDICATIONS: Atrial fibrillation    PROCEDURE:   Emergent consent assumed.  Once an appropriate level of sedation was achieved, the patient received a single biphasic, synchronized 200J shock with prompt conversion to sinus tach. No apparent complications.  Arvilla Meres, MD  9:36 AM

## 2018-12-15 NOTE — Progress Notes (Signed)
Pharmacy Antibiotic Note  Daniel Price is a 68 y.o. male admitted on 01/03/2019. He now has presumed sepsis.  Pharmacy has been consulted for vancomycin and zosyn dosing.  Tmax of 99.3, wbc normal at 8, renal functioning worsening with scr of 1.6.   Vancomycin 2000mg  now then 1500mg  IV Q 24 hrs. Goal AUC 400-550. Expected AUC: 515 SCr used: 1.6  Plan: Vancomycin per AUC protocol as above Zosyn 3.375g IV q8 hours Follow up culture data  Height: 6\' 2"  (188 cm) Weight: 183 lb 13.8 oz (83.4 kg) IBW/kg (Calculated) : 82.2  Temp (24hrs), Avg:98.5 F (36.9 C), Min:97.2 F (36.2 C), Max:99.6 F (37.6 C)  Recent Labs  Lab 12/19/2018 1630 12/13/18 0444 12/14/18 0341 12/14/18 0544 12/14/18 0820 12/14/18 2035 12/15/18 0500  WBC 26.5* 21.8* 28.9*  --   --   --  7.9  CREATININE 1.58* 1.47* 1.41*  --   --  1.58* 1.64*  LATICACIDVEN  --   --   --  2.1* 2.0*  --   --     Estimated Creatinine Clearance: 51.5 mL/min (A) (by C-G formula based on SCr of 1.64 mg/dL (H)).    No Known Allergies  Antimicrobials this admission: CTX 5/3>5/4 Vanc 5/4>> Zosyn 5/4>>  Microbiology results: BCX 5/3>>GNR COVID 5/1>> neg   Thank you for allowing pharmacy to be a part of this patient's care.  Sheppard Coil PharmD., BCPS Clinical Pharmacist 12/15/2018 1:35 PM

## 2018-12-15 NOTE — Consult Note (Signed)
WOC Nurse wound consult note Reason for Consult:Right lower buttocks full thickness tissue loss.  Presence of slough.  Unstageable pressure injuries.  2 0.5 cm round lesions. Generalized edema noted. Bedside RN states Unna boots are going to be applied to bilateral lower extremities.  Wound type:pressure and moisture Pressure Injury POA: Yes Measurement:2- 0.5 cm round lesions 100% yellow thin slough to wound bed,.  Wound bed:see above Drainage (amount, consistency, odor) scant weeping Periwound: intact Dressing procedure/placement/frequency: Continue silicone foam to buttocks wounds.  Change every three days and PRn soilage.   Will not follow at this time.  Please re-consult if needed.  Maple Hudson MSN, RN, FNP-BC CWON Wound, Ostomy, Continence Nurse Pager [phone]

## 2018-12-15 NOTE — Progress Notes (Signed)
Patient's family called for updates and provided with plan of care for the night. Family discussed code status with other members of the family and wishes patient to be FULL CODE for now. Explained that patient remains very tenuous but does not appear to be uncomfortable or in any distress. Family does not wish him to be to be uncomfortable. Pt shook his head no to being in pain and continues on fentanyl sedation. Should anything arise tonight, will notify family and keep them updated.

## 2018-12-15 NOTE — Progress Notes (Signed)
Initial Nutrition Assessment  DOCUMENTATION CODES:   Non-severe (moderate) malnutrition in context of chronic illness  INTERVENTION:   Tube feeding recommendations: - Vital 1.5 @ 50 ml/hr (1200 ml/day) - Pro-stat 30 ml TID  Tube feeding regimen provides 2100 kcal, 126 grams of protein, and 917 ml of H2O (96% of kcal needs, 100% of protein needs).  NUTRITION DIAGNOSIS:   Moderate Malnutrition related to chronic illness (COPD, CHF) as evidenced by moderate fat depletion, mild muscle depletion, moderate muscle depletion, severe muscle depletion.  GOAL:   Patient will meet greater than or equal to 90% of their needs  MONITOR:   Vent status, Labs, Weight trends, I & O's, Skin  REASON FOR ASSESSMENT:   Ventilator    ASSESSMENT:   68 year old male who presented to the ED on 5/1 with SOB and LE edema. PMH of COPD, mitral valve replacement, CHF, CVA, HTN.  5/2 - developed cardiogenic shock and transferred to ICU 5/3 - intubated 5/4 - self-extubated, reintubated  Discussed pt with RN and during ICU rounds. Also discussed pt with CCM. RD consulted yesterday for TF initiation and management. Per CCM, hold off on starting TF at this time. RD will d/c consult and leave TF recommendations.  No weights available PTA.  OGT in place per x-ray.  Patient is currently intubated on ventilator support MV: 16 L/min Temp (24hrs), Avg:98.6 F (37 C), Min:97.2 F (36.2 C), Max:99.6 F (37.6 C) BP (a-line): 79/49 MAP:57  Amiodarone: 33.3 ml/hr Epinephrine: 45 ml/hr Fentanyl: 10 ml/hr Levophed: 28.1 ml/hr Vasopressin: 11.3 ml/hr Heparin: 12 ml/hr  Medications reviewed and include: Protonix, KCl 40 mEq x 2, IV abx  Labs reviewed: potassium 2.9 (L), ionized calcium 1.09 (L), hemoglobin 9.5 (L), BUN 50 (H), creatinine 1.64 (H) CBG's: 119, 174, 167  UOP: 1725 ml x 24 hours I/O's: +1.3 L since admit  NUTRITION - FOCUSED PHYSICAL EXAM:    Most Recent Value  Orbital Region   Moderate depletion  Upper Arm Region  Moderate depletion  Thoracic and Lumbar Region  Moderate depletion  Buccal Region  Unable to assess  Temple Region  Moderate depletion  Clavicle Bone Region  Severe depletion  Clavicle and Acromion Bone Region  Severe depletion  Scapular Bone Region  Unable to assess  Dorsal Hand  Mild depletion  Patellar Region  Unable to assess [severe edema]  Anterior Thigh Region  Unable to assess  Posterior Calf Region  Unable to assess  Edema (RD Assessment)  Severe [BUE, BLE]  Hair  Reviewed  Eyes  Unable to assess  Mouth  Unable to assess  Skin  Reviewed  Nails  Reviewed       Diet Order:   Diet Order            Diet NPO time specified  Diet effective now              EDUCATION NEEDS:   Not appropriate for education at this time  Skin:  Skin Assessment: Skin Integrity Issues: Stage II: right buttocks  Last BM:  12/15/18 large type 7  Height:   Ht Readings from Last 1 Encounters:  12/29/2018 6\' 2"  (1.88 m)    Weight:   Wt Readings from Last 1 Encounters:  12/14/18 83.4 kg    Ideal Body Weight:  86.4 kg  BMI:  Body mass index is 23.61 kg/m.  Estimated Nutritional Needs:   Kcal:  2184  Protein:  115-130 grams  Fluid:  >/= 2.0 L    Jae Dire  Darrold Span, MS, RD, [place] Dietitian Pager: [phone] Weekend/After Hours: [phone]

## 2018-12-15 NOTE — Progress Notes (Signed)
ANTICOAGULATION CONSULT NOTE - Follow Up Consult  Pharmacy Consult for heparin Indication: atrial fibrillation  No Known Allergies  Patient Measurements: Height: 6\' 2"  (188 cm) Weight: 183 lb 13.8 oz (83.4 kg) IBW/kg (Calculated) : 82.2 Heparin Dosing Weight: 83  Vital Signs: Temp: 97.2 F (36.2 C) (05/04 0000) Temp Source: Axillary (05/04 0000) BP: 83/71 (05/04 0600) Pulse Rate: 133 (05/04 0600)  Labs: Recent Labs    12-29-18 2235 12/13/18 0444 12/13/18 1023  12/14/18 0341 12/14/18 1827  12/14/18 2035 12/15/18 0040 12/15/18 0500 12/15/18 0529  HGB  --  9.3*  --    < > 9.3*  --    < >  --  10.2* 9.1* 10.5*  HCT  --  33.1*  --    < > 31.8*  --    < >  --  30.0* 30.7* 31.0*  PLT  --  155  --   --  197  --   --   --   --  182  --   HEPARINUNFRC  --   --   --   --   --  <0.10*  --   --   --  0.39  --   CREATININE  --  1.47*  --   --  1.41*  --   --  1.58*  --   --   --   TROPONINI 0.09* 0.09* 0.08*  --   --   --   --   --   --   --   --    < > = values in this interval not displayed.    Estimated Creatinine Clearance: 53.5 mL/min (A) (by C-G formula based on SCr of 1.58 mg/dL (H)).   Medical History: Past Medical History:  Diagnosis Date  . Anemia   . CHF (congestive heart failure) (HCC)   . Coronary artery disease   . History of SBE (subacute bacterial endocarditis)   . Hypercholesteremia   . Ischemic cardiomyopathy   . Myocardial infarction (HCC) 2006  . Polysubstance abuse (HCC)    Hx of  . Stroke (HCC)   . Transient ischemic attack, acute     Assessment: 36 yoM admitted with severe CHF started on inotropic support. Pt noted to have AFib this morning, pharmacy asked to start IV heparin. Pt received subcutaneous heparin this morning so will avoid bolus.   Heparin level therapeutic this AM at 0.39 on 1200 units/hr. Hgb stable, plts ok, RN reports some bleeding from a-line that has now stopped.   Goal of Therapy:  Heparin level 0.3-0.7 units/ml Monitor  platelets by anticoagulation protocol: Yes   Plan:  Continue Heparin 1200 units/hr  Heparin level in 8 hours Daily heparin level, CBC, s/sx bleeding  Lenward Chancellor, PharmD PGY1 Pharmacy Resident Phone [phone] 12/15/2018 7:03 AM

## 2018-12-15 NOTE — Progress Notes (Signed)
ANTICOAGULATION CONSULT NOTE - Follow Up Consult  Pharmacy Consult for heparin Indication: atrial fibrillation  No Known Allergies  Patient Measurements: Height: 6\' 2"  (188 cm) Weight: 183 lb 13.8 oz (83.4 kg) IBW/kg (Calculated) : 82.2 Heparin Dosing Weight: 83  Vital Signs: Temp: 99.6 F (37.6 C) (05/04 0742) Temp Source: Axillary (05/04 0742) BP: 85/73 (05/04 1345) Pulse Rate: 224 (05/04 1345)  Labs: Recent Labs    12/16/2018 2235 12/13/18 0444 12/13/18 1023  12/14/18 0341 12/14/18 1827  12/14/18 2035  12/15/18 0500 12/15/18 0529 12/15/18 0917 12/15/18 1118 12/15/18 1447  HGB  --  9.3*  --    < > 9.3*  --    < >  --    < > 9.1* 10.5* 9.2* 9.5*  --   HCT  --  33.1*  --    < > 31.8*  --    < >  --    < > 30.7* 31.0* 27.0* 28.0*  --   PLT  --  155  --   --  197  --   --   --   --  182  --   --   --   --   HEPARINUNFRC  --   --   --   --   --  <0.10*  --   --   --  0.39  --   --   --  0.43  CREATININE  --  1.47*  --   --  1.41*  --   --  1.58*  --  1.64*  --   --   --  2.28*  TROPONINI 0.09* 0.09* 0.08*  --   --   --   --   --   --   --   --   --   --   --    < > = values in this interval not displayed.    Estimated Creatinine Clearance: 37.1 mL/min (A) (by C-G formula based on SCr of 2.28 mg/dL (H)).   Infusion medications: . amiodarone 60 mg/hr (12/15/18 1400)  . epinephrine 20 mcg/min (12/15/18 1532)  . fentaNYL infusion INTRAVENOUS 50 mcg/hr (12/15/18 1400)  . heparin 1,200 Units/hr (12/15/18 1400)  . milrinone Stopped (12/15/18 0850)  . norepinephrine (LEVOPHED) Adult infusion 45 mcg/min (12/15/18 1421)  . phenylephrine (NEO-SYNEPHRINE) Adult infusion    . piperacillin-tazobactam (ZOSYN)  IV 12.5 mL/hr at 12/15/18 1400  . [START ON 12/16/2018] vancomycin    . vasopressin (PITRESSIN) infusion - *FOR SHOCK* 0.03 Units/min (12/15/18 1400)    Assessment: 5 yoM admitted with severe CHF started on inotropic support. Pt noted to have AFib, pharmacy asked to start  IV heparin.  Heparin level therapeutic at 0.43 on 1200 units/hr. Previous report of some bleeding from a-line that has now stopped, no further bleeding per RN.   Goal of Therapy:  Heparin level 0.3-0.7 units/ml Monitor platelets by anticoagulation protocol: Yes   Plan:  Continue heparin drip at 1200 units/hr  Daily heparin level, CBC, s/sx bleeding   Loura Back, PharmD, BCPS Clinical Pharmacist Clinical phone for 12/15/2018 until 10p is [phone] 12/15/2018 3:34 PM  **Pharmacist phone directory can now be found on [email or web address] listed under [facility]**

## 2018-12-15 NOTE — Progress Notes (Signed)
PHARMACY - PHYSICIAN COMMUNICATION CRITICAL VALUE ALERT - BLOOD CULTURE IDENTIFICATION (BCID)  DEMTRIUS ENTSMINGER is an 68 y.o. male who presented to [facility] on 01/03/2019 with acute on CHF w/ severe RV failure.  Intubated in ICU, self extubated this AM.  WBC up to 28.9, LA 2.0.  Assessment:  3/4 GNR, BCID enterobacteriaceae species only  Name of physician (or Provider) Contacted: Singasani  Current antibiotics: None  Changes to prescribed antibiotics recommended:  Ceftriaxone 2g IV every 24h, f/u BCx ID+ susceptibilities   Results for orders placed or performed during the hospital encounter of 01/08/2019  Blood Culture ID Panel (Reflexed) (Collected: 12/14/2018 11:45 AM)  Result Value Ref Range   Enterococcus species NOT DETECTED NOT DETECTED   Listeria monocytogenes NOT DETECTED NOT DETECTED   Staphylococcus species NOT DETECTED NOT DETECTED   Staphylococcus aureus (BCID) NOT DETECTED NOT DETECTED   Streptococcus species NOT DETECTED NOT DETECTED   Streptococcus agalactiae NOT DETECTED NOT DETECTED   Streptococcus pneumoniae NOT DETECTED NOT DETECTED   Streptococcus pyogenes NOT DETECTED NOT DETECTED   Acinetobacter baumannii NOT DETECTED NOT DETECTED   Enterobacteriaceae species DETECTED (A) NOT DETECTED   Enterobacter cloacae complex NOT DETECTED NOT DETECTED   Escherichia coli NOT DETECTED NOT DETECTED   Klebsiella oxytoca NOT DETECTED NOT DETECTED   Klebsiella pneumoniae NOT DETECTED NOT DETECTED   Proteus species NOT DETECTED NOT DETECTED   Serratia marcescens NOT DETECTED NOT DETECTED   Carbapenem resistance NOT DETECTED NOT DETECTED   Haemophilus influenzae NOT DETECTED NOT DETECTED   Neisseria meningitidis NOT DETECTED NOT DETECTED   Pseudomonas aeruginosa NOT DETECTED NOT DETECTED   Candida albicans NOT DETECTED NOT DETECTED   Candida glabrata NOT DETECTED NOT DETECTED   Candida krusei NOT DETECTED NOT DETECTED   Candida parapsilosis NOT DETECTED NOT DETECTED   Candida  tropicalis NOT DETECTED NOT DETECTED    Daylene Posey 12/15/2018  4:55 AM

## 2018-12-15 NOTE — Progress Notes (Signed)
  He remains critically ill with septic/cardiogenic shock.   Now on EPI 20 and NE 65 and vasopressin 0.03  SBP 70-80s. HR improved. Now 100-120 on amio.   I called and spoke with his sister to update her and let her know that he remains very tenuous and may not make it through the night.   We again discussed Code Status. She is discussing with family tonight and will let us know. For now remains full code.   Will continue supportive care.   Additional CCT 35 mins.   Arvilla Meres, MD  5:29 PM

## 2018-12-16 DIAGNOSIS — E440 Moderate protein-calorie malnutrition: Secondary | ICD-10-CM

## 2018-12-16 DIAGNOSIS — A4150 Gram-negative sepsis, unspecified: Secondary | ICD-10-CM

## 2018-12-16 LAB — BLOOD GAS, ARTERIAL
Acid-base deficit: 9.7 mmol/L — ABNORMAL HIGH (ref 0.0–2.0)
Bicarbonate: 15.1 mmol/L — ABNORMAL LOW (ref 20.0–28.0)
FIO2: 40
MECHVT: 500 mL
O2 Saturation: 94.3 %
PEEP: 5 cmH2O
Patient temperature: 98.6
RATE: 22 resp/min
pCO2 arterial: 29.5 mmHg — ABNORMAL LOW (ref 32.0–48.0)
pH, Arterial: 7.328 — ABNORMAL LOW (ref 7.350–7.450)
pO2, Arterial: 74.7 mmHg — ABNORMAL LOW (ref 83.0–108.0)

## 2018-12-16 LAB — CBC
HCT: 29.6 % — ABNORMAL LOW (ref 39.0–52.0)
Hemoglobin: 8.6 g/dL — ABNORMAL LOW (ref 13.0–17.0)
MCH: 20.4 pg — ABNORMAL LOW (ref 26.0–34.0)
MCHC: 29.1 g/dL — ABNORMAL LOW (ref 30.0–36.0)
MCV: 70.1 fL — ABNORMAL LOW (ref 80.0–100.0)
Platelets: DECREASED 10*3/uL (ref 150–400)
RBC: 4.22 MIL/uL (ref 4.22–5.81)
RDW: 20.6 % — ABNORMAL HIGH (ref 11.5–15.5)
WBC: 22.8 10*3/uL — ABNORMAL HIGH (ref 4.0–10.5)
nRBC: 4.1 % — ABNORMAL HIGH (ref 0.0–0.2)

## 2018-12-16 LAB — POCT I-STAT 7, (LYTES, BLD GAS, ICA,H+H)
Acid-base deficit: 11 mmol/L — ABNORMAL HIGH (ref 0.0–2.0)
Acid-base deficit: 11 mmol/L — ABNORMAL HIGH (ref 0.0–2.0)
Bicarbonate: 14.5 mmol/L — ABNORMAL LOW (ref 20.0–28.0)
Bicarbonate: 14.2 mmol/L — ABNORMAL LOW (ref 20.0–28.0)
Calcium, Ion: 1.02 mmol/L — ABNORMAL LOW (ref 1.15–1.40)
Calcium, Ion: 1.05 mmol/L — ABNORMAL LOW (ref 1.15–1.40)
HCT: 29 % — ABNORMAL LOW (ref 39.0–52.0)
HCT: 28 % — ABNORMAL LOW (ref 39.0–52.0)
Hemoglobin: 9.9 g/dL — ABNORMAL LOW (ref 13.0–17.0)
Hemoglobin: 9.5 g/dL — ABNORMAL LOW (ref 13.0–17.0)
O2 Saturation: 98 %
O2 Saturation: 98 %
Patient temperature: 98
Patient temperature: 37.3
Potassium: 3.7 mmol/L (ref 3.5–5.1)
Potassium: 3.6 mmol/L (ref 3.5–5.1)
Sodium: 135 mmol/L (ref 135–145)
Sodium: 134 mmol/L — ABNORMAL LOW (ref 135–145)
TCO2: 15 mmol/L — ABNORMAL LOW (ref 22–32)
TCO2: 15 mmol/L — ABNORMAL LOW (ref 22–32)
pCO2 arterial: 28.9 mmHg — ABNORMAL LOW (ref 32.0–48.0)
pCO2 arterial: 27.5 mmHg — ABNORMAL LOW (ref 32.0–48.0)
pH, Arterial: 7.307 — ABNORMAL LOW (ref 7.350–7.450)
pH, Arterial: 7.321 — ABNORMAL LOW (ref 7.350–7.450)
pO2, Arterial: 115 mmHg — ABNORMAL HIGH (ref 83.0–108.0)
pO2, Arterial: 104 mmHg (ref 83.0–108.0)

## 2018-12-16 LAB — BASIC METABOLIC PANEL
Anion gap: 15 (ref 5–15)
BUN: 53 mg/dL — ABNORMAL HIGH (ref 8–23)
CO2: 13 mmol/L — ABNORMAL LOW (ref 22–32)
Calcium: 7 mg/dL — ABNORMAL LOW (ref 8.9–10.3)
Chloride: 107 mmol/L (ref 98–111)
Creatinine, Ser: 2.58 mg/dL — ABNORMAL HIGH (ref 0.61–1.24)
GFR calc Af Amer: 29 mL/min — ABNORMAL LOW (ref >60)
GFR calc non Af Amer: 25 mL/min — ABNORMAL LOW (ref >60)
Glucose, Bld: 99 mg/dL (ref 70–99)
Potassium: 3.6 mmol/L (ref 3.5–5.1)
Sodium: 135 mmol/L (ref 135–145)

## 2018-12-16 LAB — CULTURE, BLOOD (ROUTINE X 2): Special Requests: ADEQUATE

## 2018-12-16 LAB — C DIFFICILE QUICK SCREEN W PCR REFLEX
C Diff interpretation: NOT DETECTED
C Diff toxin: NEGATIVE

## 2018-12-16 LAB — GLUCOSE, CAPILLARY
Glucose-Capillary: 105 mg/dL — ABNORMAL HIGH (ref 70–99)
Glucose-Capillary: 83 mg/dL (ref 70–99)
Glucose-Capillary: 87 mg/dL (ref 70–99)
Glucose-Capillary: 83 mg/dL (ref 70–99)

## 2018-12-16 LAB — HEPARIN LEVEL (UNFRACTIONATED): Heparin Unfractionated: 0.3 IU/mL (ref 0.30–0.70)

## 2018-12-16 LAB — COOXEMETRY PANEL
Carboxyhemoglobin: 1.7 % — ABNORMAL HIGH (ref 0.5–1.5)
Methemoglobin: 2 % — ABNORMAL HIGH (ref 0.0–1.5)
O2 Saturation: 73.6 %
Total hemoglobin: 8.8 g/dL — ABNORMAL LOW (ref 12.0–16.0)

## 2018-12-16 LAB — PHOSPHORUS: Phosphorus: 4.6 mg/dL (ref 2.5–4.6)

## 2018-12-16 LAB — C DIFFICILE QUICK SCREEN W PCR REFLEX??: C Diff antigen: NEGATIVE

## 2018-12-16 LAB — LACTIC ACID, PLASMA: Lactic Acid, Venous: 2.8 mmol/L (ref 0.5–1.9)

## 2018-12-16 LAB — MAGNESIUM: Magnesium: 2.1 mg/dL (ref 1.7–2.4)

## 2018-12-16 MED ORDER — CALCIUM GLUCONATE-NACL 2-0.675 GM/100ML-% IV SOLN
2.0000 g | Freq: Once | INTRAVENOUS | Status: AC
  Administered 2018-12-16: 2000 mg via INTRAVENOUS
  Filled 2018-12-16: qty 100

## 2018-12-16 MED ORDER — SODIUM CHLORIDE 0.9% FLUSH
10.0000 mL | Freq: Two times a day (BID) | INTRAVENOUS | Status: DC
  Administered 2018-12-16 – 2018-12-18 (×5): 10 mL

## 2018-12-16 MED ORDER — LACTATED RINGERS IV BOLUS
500.0000 mL | Freq: Once | INTRAVENOUS | Status: AC
  Administered 2018-12-16: 500 mL via INTRAVENOUS

## 2018-12-16 MED ORDER — CHLORHEXIDINE GLUCONATE CLOTH 2 % EX PADS
6.0000 | MEDICATED_PAD | Freq: Every day | CUTANEOUS | Status: DC
  Administered 2018-12-16 – 2018-12-18 (×3): 6 via TOPICAL

## 2018-12-16 MED ORDER — VITAL 1.5 CAL PO LIQD
1000.0000 mL | ORAL | Status: DC
  Administered 2018-12-16: 1000 mL
  Filled 2018-12-16 (×2): qty 1000

## 2018-12-16 MED ORDER — SODIUM CHLORIDE 0.9% FLUSH: 10.0000 mL | INTRAVENOUS | Status: DC | PRN

## 2018-12-16 MED ORDER — DOPAMINE-DEXTROSE 3.2-5 MG/ML-% IV SOLN
2.5000 ug/kg/min | INTRAVENOUS | Status: DC
  Administered 2018-12-16 – 2018-12-18 (×2): 5 ug/kg/min via INTRAVENOUS
  Filled 2018-12-16: qty 250

## 2018-12-16 MED ORDER — DOPAMINE-DEXTROSE 3.2-5 MG/ML-% IV SOLN
INTRAVENOUS | Status: AC
  Administered 2018-12-16: 5 ug/kg/min via INTRAVENOUS
  Filled 2018-12-16: qty 250

## 2018-12-16 MED ORDER — HYDROCORTISONE NA SUCCINATE PF 100 MG IJ SOLR
100.0000 mg | Freq: Four times a day (QID) | INTRAMUSCULAR | Status: DC
  Administered 2018-12-17 – 2018-12-18 (×6): 100 mg via INTRAVENOUS
  Filled 2018-12-16 (×6): qty 2

## 2018-12-16 NOTE — Progress Notes (Addendum)
CRITICAL VALUE ALERT  Critical Value:  Lactic 2.8  Date & Time Notied:  12/16/18 1642  Provider Notified: Everardo All  Orders Received/Actions taken: MD notified. Orders for 50cc bolus LR received.

## 2018-12-16 NOTE — Progress Notes (Signed)
eLink Physician-Brief Progress Note Patient Name: ARVO RENTER DOB: May 17, 1951 MRN: [id number]   Date of Service  12/16/2018  HPI/Events of Note  Patient has multiple watery stools.   eICU Interventions  Considering frequent diarrhea, fever and leukocytosis ordered C. difficile testing.      Intervention Category Intermediate Interventions: Communication with other healthcare providers and/or family Evaluation Type: Other  Jae Dire Mukund Weinreb 12/16/2018, 6:48 AM

## 2018-12-16 NOTE — Progress Notes (Signed)
NAME:  Daniel Price, MRN:  [id number], DOB:  08-31-1950, LOS: 4 ADMISSION DATE:  01/11/2019, CONSULTATION DATE:  12/14/2018 REFERRING MD:  Dr. Gala RomneyBensimhon, Cardiology, CHIEF COMPLAINT:  Short of breath   Brief History   68 yr old male smoker presented with 3 days of dyspnea, wheeze, and leg swelling.  Treated for CHF exacerbation.  Required intubation 5/3  for respiratory support.  Past Medical History  COPD, MV replacement, CHF, CVA, HTN, CAD, Substance abuse  Significant Hospital Events   5/01 Admit 5/03 Intubated  Consults:  Cardiology  Procedures:  Lt IJ CVL 5/02 >> ETT 5/03 >>  Significant Diagnostic Tests:  Echo 5/02 >> EF 45 to 50%, septal flattening, severe RV systolic dysfx, mod pericardial effusion  Micro Data:  COVID 5/01 >> negative BCx 5/03 >> GNR, pending Trach asp 5/4  Antimicrobials:  Ceftriaxone 5/4> Vanc 5/4 Zosyn 5/4  Interim history/subjective:  Self-extubated overnight. 5/4 overnight>> was short of breath Required re-intubation 5/4 am Remains on 60%, RR 25, TV 650, 5 PEEP Diarrhea 5/5/ overnight>> Checking C diff Pressor dependent  Levo at 70 mcg Epi gtt at 20 mcg Vaso at 0.03 Amio at 60 + 4.7  L  Objective   Blood pressure (!) 90/51, pulse (!) 116, temperature 97.8 F (36.6 C), temperature source Axillary, resp. rate (!) 25, height 6\' 2"  (1.88 m), weight 87.5 kg, SpO2 94 %. CVP:  [12 mmHg-14 mmHg] 14 mmHg  Vent Mode: PRVC FiO2 (%):  [40 %-100 %] 60 % Set Rate:  [25 bmp] 25 bmp Vt Set:  [650 mL] 650 mL PEEP:  [8 cmH20-10 cmH20] 10 cmH20 Plateau Pressure:  [19 cmH20-26 cmH20] 20 cmH20   Intake/Output Summary (Last 24 hours) at 12/16/2018 0841 Last data filed at 12/16/2018 0700 Gross per 24 hour  Intake 6535.73 ml  Output 1300 ml  Net 5235.73 ml   Filed Weights   12/13/18 0432 12/14/18 0349 12/16/18 0500  Weight: 83 kg 83.4 kg 87.5 kg   Physical Exam: General: Thin-appearing male, intubated and sedated , on pressors HENT: , AT, ETT  secure and intact, No LAD Respiratory: Bilateral chest excursion, coarse breath sounds, diminished per bases. R>L Cardiovascular: S1, S2, IRR,  -M/R/G, no JVD,  A fib per tele GI: BS+, soft, non tender, ND, flexiseal Extremities:2+ pitting edema,-tenderness, wrapped, brisk refill Neuro: sedated, follows commands with WUA, MAE x 4 GU: Condom cath in place, minimal UO  No CXR 5/5  Resolved Hospital Problem list     Assessment & Plan:   Acute hypoxic respiratory failure. COPD. COVID-19 negative Self extubated 5/4 requiring re-intubation Blood tinged secretions 5/5 Plan - Full vent support - ABG now and prn - Wean FiO2 and PEEP as able - Hold diuresis for now as pressor dependent - Scheduled BD - CXR in am and prn  Septic Shock secondary GNR bacteremia. Plan - Broaden antibiotics to Vanc and Zosyn - IVF bolus - Pressor support for MAP goal >65 - F/u final cultures - Repeat BCx for clearance - Trend lactate  Acute on chronic diastolic CHF with RV failure. Cardiogenic shock. Mitral stenosis s/p bioprosthetic MV. A fib with RVR. Plan - Amiodarone for AFRVR - Inotropic and pressor support  - Continue  vasopressin - F/u CVP, cooximetry  Renal>> AKI - Creatinine continues to climb - Minimal UO>> condom cath P Monitor BMET and UO Avoid nephrotoxic medications Maintain renal perfusion Bladder scan prn Consider placing Foley cath  Nutrition TF on hold since extubation 5/4 P Consider  Trickle feeds  CKD 2. Hypokalemia. Plan - Daily BMET - Replete K prn  Anemia of critical illness, iron deficiency and chronic disease. Heparin gtt Plan - f/u CBC - transfuse for Hb < 7 - Monitor for obvious signs of bleeding  Goals of Care conversation has been initiated by Dr. Gala Romney with sisters. They needed more time to discuss   Best practice:  Diet: NPO DVT prophylaxis: heparin gtt GI prophylaxis: protonix Mobility: bed rest Code Status: full code  Disposition: ICU  Labs   CBC: Recent Labs  Lab 18-Dec-2018 1630  12/13/18 0444  12/14/18 0341  12/15/18 0500  12/15/18 0917 12/15/18 1118 12/15/18 1338 12/15/18 1723 12/16/18 0353  WBC 26.5*  --  21.8*  --  28.9*  --  7.9  --   --   --   --   --  22.8*  NEUTROABS 24.5*  --   --   --   --   --   --   --   --   --   --   --   --   HGB 10.4*   < > 9.3*   < > 9.3*   < > 9.1*   < > 9.2* 9.5* 9.9* 9.9* 8.6*  HCT 38.3*   < > 33.1*   < > 31.8*   < > 30.7*   < > 27.0* 28.0* 29.0* 29.0* 29.6*  MCV 76.3*  --  71.8*  --  70.2*  --  69.6*  --   --   --   --   --  70.1*  PLT 219  --  155  --  197  --  182  --   --   --   --   --  PLATELET CLUMPS NOTED ON SMEAR, COUNT APPEARS DECREASED   < > = values in this interval not displayed.    Basic Metabolic Panel: Recent Labs  Lab 12/14/18 0341  12/14/18 2035  12/15/18 0500  12/15/18 1118 12/15/18 1338 12/15/18 1447 12/15/18 1723 12/16/18 0353  NA 138   < > 136   < > 136   < > 140 140 137 138 135  K 3.4*   < > 2.6*   < > 3.3*   < > 2.9* 3.0* 3.0* 3.3* 3.6  CL 106  --  104  --  107  --   --   --  107  --  107  CO2 20*  --  19*  --  18*  --   --   --  14*  --  13*  GLUCOSE 130*  --  165*  --  117*  --   --   --  133*  --  99  BUN 44*  --  46*  --  50*  --   --   --  52*  --  53*  CREATININE 1.41*  --  1.58*  --  1.64*  --   --   --  2.28*  --  2.58*  CALCIUM 8.5*  --  8.2*  --  7.9*  --   --   --  7.2*  --  7.0*  MG 2.0  --  2.1  --  1.8  --   --   --  2.3  --  2.1  PHOS 4.0  --  4.8*  --  3.8  --   --   --  3.9  --  4.6   < > = values  in this interval not displayed.   GFR: Estimated Creatinine Clearance: 32.7 mL/min (A) (by C-G formula based on SCr of 2.58 mg/dL (H)). Recent Labs  Lab 12/13/18 0444 12/14/18 0341 12/14/18 0544 12/14/18 0820 12/15/18 0500 12/16/18 0353  WBC 21.8* 28.9*  --   --  7.9 22.8*  LATICACIDVEN  --   --  2.1* 2.0*  --   --     Liver Function Tests: Recent Labs  Lab 12/29/2018 1630  AST 53*  ALT <5   ALKPHOS 84  BILITOT 2.2*  PROT 6.4*  ALBUMIN 2.9*   No results for input(s): LIPASE, AMYLASE in the last 168 hours. No results for input(s): AMMONIA in the last 168 hours.  ABG    Component Value Date/Time   PHART 7.203 (L) 12/15/2018 1723   PCO2ART 33.5 12/15/2018 1723   PO2ART 115.0 (H) 12/15/2018 1723   HCO3 13.2 (L) 12/15/2018 1723   TCO2 14 (L) 12/15/2018 1723   ACIDBASEDEF 14.0 (H) 12/15/2018 1723   O2SAT 73.6 12/16/2018 0420     Coagulation Profile: No results for input(s): INR, PROTIME in the last 168 hours.  Cardiac Enzymes: Recent Labs  Lab 12/17/2018 1630 12/17/2018 2235 12/13/18 0444 12/13/18 1023  TROPONINI 0.09* 0.09* 0.09* 0.08*    HbA1C: No results found for: HGBA1C  CBG: Recent Labs  Lab 12/15/18 1120 12/15/18 1618 12/15/18 2049 12/15/18 2357 12/16/18 0353  GLUCAP 109* 134* 138* 125* 105*    Critical care time: 40 min      Bevelyn Ngo, AGACNP-BC [facility] Pulmonary/Critical Care Medicine Pager # [phone] After 4 pm call [phone] 12/16/2018 8:42 AM

## 2018-12-16 NOTE — Progress Notes (Signed)
ABG values communicated to Dr. Everardo All and patient vent settings adjusted by MD. FiO2 was adjusted to 40 and respirations to 22. RT called and notified of changes made.

## 2018-12-16 NOTE — Progress Notes (Signed)
Infection Prevention called to notify of patients positive salmonella results from his Blood Culture. IP advised to keep patient on enteric precautions till patient has no more watery stools despite flexiseal being in place.

## 2018-12-16 NOTE — Progress Notes (Signed)
ANTICOAGULATION CONSULT NOTE - Follow Up Consult  Pharmacy Consult for heparin Indication: atrial fibrillation  No Known Allergies  Patient Measurements: Height: 6\' 2"  (188 cm) Weight: 192 lb 14.4 oz (87.5 kg) IBW/kg (Calculated) : 82.2 Heparin Dosing Weight: 83  Vital Signs: Temp: 97.7 F (36.5 C) (05/05 0358) Temp Source: Axillary (05/05 0358) BP: 92/66 (05/05 0615) Pulse Rate: 204 (05/05 0615)  Labs: Recent Labs    12/13/18 1023  12/14/18 0341  12/15/18 0500  12/15/18 1338 12/15/18 1447 12/15/18 1723 12/16/18 0353 12/16/18 0449  HGB  --    < > 9.3*   < > 9.1*   < > 9.9*  --  9.9* 8.6*  --   HCT  --    < > 31.8*   < > 30.7*   < > 29.0*  --  29.0* 29.6*  --   PLT  --   --  197  --  182  --   --   --   --  PLATELET CLUMPS NOTED ON SMEAR, COUNT APPEARS DECREASED  --   HEPARINUNFRC  --   --   --    < > 0.39  --   --  0.43  --   --  0.30  CREATININE  --   --  1.41*   < > 1.64*  --   --  2.28*  --  2.58*  --   TROPONINI 0.08*  --   --   --   --   --   --   --   --   --   --    < > = values in this interval not displayed.    Estimated Creatinine Clearance: 32.7 mL/min (A) (by C-G formula based on SCr of 2.58 mg/dL (H)).   Infusion medications: . amiodarone 60 mg/hr (12/16/18 0634)  . epinephrine 20 mcg/min (12/16/18 0600)  . fentaNYL infusion INTRAVENOUS 100 mcg/hr (12/16/18 0600)  . heparin 1,200 Units/hr (12/16/18 0600)  . milrinone Stopped (12/15/18 0850)  . norepinephrine (LEVOPHED) Adult infusion 66 mcg/min (12/16/18 0600)  . phenylephrine (NEO-SYNEPHRINE) Adult infusion    . piperacillin-tazobactam (ZOSYN)  IV 12.5 mL/hr at 12/16/18 0600  . vancomycin    . vasopressin (PITRESSIN) infusion - *FOR SHOCK* 0.03 Units/min (12/16/18 0600)    Assessment: 63 yoM admitted with severe CHF started on inotropic support. Pt noted to have AFib, pharmacy asked to start IV heparin.  Heparin level therapeutic at 0.3 on 1200 units/hr. Hgb down to 8.6, SCR trending up, pt  having rare bloody sputum but no major bleeding per RN.   Goal of Therapy:  Heparin level 0.3-0.7 units/ml Monitor platelets by anticoagulation protocol: Yes   Plan:  Continue heparin drip at 1200 units/hr  Daily heparin level, CBC, s/sx bleeding   Lenward Chancellor, PharmD PGY1 Pharmacy Resident 12/16/2018 6:34 AM  **Pharmacist phone directory can now be found on [email or web address] listed under [facility]**

## 2018-12-16 NOTE — Progress Notes (Signed)
Orthopedic Tech Progress Note Patient Details:  JALEAL VANDEBERG Dec 19, 1950 [id number]  Ortho Devices Type of Ortho Device: Roland Rack boot Ortho Device/Splint Location: bilateral Ortho Device/Splint Interventions: Adjustment, Application, Ordered   Post Interventions Patient Tolerated: Well Instructions Provided: Care of device, Adjustment of device   Erhard Pore 12/16/2018, 9:33 AM

## 2018-12-16 NOTE — Progress Notes (Signed)
Nutrition Follow-up  RD working remotely.  DOCUMENTATION CODES:   Non-severe (moderate) malnutrition in context of chronic illness  INTERVENTION:   Initiate trickle TF via OGT: - Vital 1.5 @ 20 ml/hr (480 ml/day)  Trickle tube feeding regimen provides 720 kcal, 32 grams of protein, and 367 ml of H2O.  Goal TF regimen: - Vital 1.5 @ 50 ml/hr (1200 ml/day) - Pro-stat 30 ml TID  Goal tube feeding regimen provides 2100 kcal, 126 grams of protein, and 917 ml of H2O (100% of needs).  NUTRITION DIAGNOSIS:   Moderate Malnutrition related to chronic illness (COPD, CHF) as evidenced by moderate fat depletion, mild muscle depletion, moderate muscle depletion, severe muscle depletion.  Ongoing, being addressed via trickle TF  GOAL:   Patient will meet greater than or equal to 90% of their needs  Progressing  MONITOR:   Vent status, Weight trends, Labs, I & O's, Skin, TF tolerance  REASON FOR ASSESSMENT:   Consult Enteral/tube feeding initiation and management (trickle)  ASSESSMENT:   68 year old male who presented to the ED on 5/1 with SOB and LE edema. PMH of COPD, mitral valve replacement, CHF, CVA, HTN.  5/2 - developed cardiogenic shock and transferred to ICU 5/3 - intubated 5/4 - self-extubated, reintubated  Consult received to start trickle TF. Discussed with RN. Per RN, pt following commands.  Reviewed RN edema assessment. Pt with moderate pitting generalized edema, moderate pitting edema to BUE, and very deep pitting edema to BLE. Difficult to discern dry weight given severe edema but will utilize admission weight of 83 kg as EDW.  Patient is currently intubated on ventilator support MV: 16.6 L/min Temp (24hrs), Avg:98 F (36.7 C), Min:97.6 F (36.4 C), Max:98.5 F (36.9 C) BP (a-line): 95/52 MAP (a-line): 62  Amiodarone: 33.3 ml/hr Epinephrine: 75 ml/hr Fentanyl: 10 ml/hr Heparin: 12 ml/hr Levophed: 67.5 ml/hr Vasopressin: 11.25 ml/hr  Medications  reviewed and include: Protonix, IV abx  Labs reviewed: BUN 53 (H), creatinine 2.58 (H), hemoglobin 9.6 (L) CBG's: 105, 125, 138, 134 x 24 hours K+, mag, phos all WNL.  UOP: 145 ml x 24 hours OGT: 1100 ml output x 24 hours Rectal tube: 200 ml x 24 hours I/O's: +5.1 L since admit  Diet Order:   Diet Order            Diet NPO time specified  Diet effective now              EDUCATION NEEDS:   Not appropriate for education at this time  Skin:  Skin Assessment: Skin Integrity Issues: Stage II: right buttocks  Last BM:  12/16/18 rectal tube  Height:   Ht Readings from Last 1 Encounters:  01/08/2019 6\' 2"  (1.88 m)    Weight:   Wt Readings from Last 1 Encounters:  12/16/18 87.5 kg   EDW: 83 kg  Ideal Body Weight:  86.4 kg  BMI:  Body mass index is 24.77 kg/m.  Estimated Nutritional Needs:   Kcal:  2099  Protein:  115-130 grams  Fluid:  >/= 2.0 L    Earma Reading, MS, RD, LDN Inpatient Clinical Dietitian Pager: [phone] Weekend/After Hours: [phone]

## 2018-12-16 NOTE — Progress Notes (Signed)
Progress Note  Patient Name: Daniel Price Date of Encounter: 12/16/2018  Primary Cardiologist: [place] line placed Initial co-ox 28%. Moved to ICU due to respiratory distress  Developed profound GNR sepsis yesterday. Re-intubated. Now on vasopressin, epi 20 and NE 70. SBP 80-90s. Awake on vent. Following commands. On amio at 34 for AF. Renal function worse. Minimal urine output   Bcx 1/1 samonella  Echo performed. LVEF ok. MV thickened and appears stenotic. Mean gradient ~20. RV markedly dialted and severely HK.   Inpatient Medications    Scheduled Meds: . atorvastatin  40 mg Per Tube q1800  . chlorhexidine gluconate (MEDLINE KIT)  15 mL Mouth Rinse BID  . Chlorhexidine Gluconate Cloth  6 each Topical Daily  . clopidogrel  75 mg Per Tube QPM  . fentaNYL (SUBLIMAZE) injection  50 mcg Intravenous Once  . mouth rinse  15 mL Mouth Rinse 10 times per day  . pantoprazole sodium  40 mg Per Tube Q24H  . sodium chloride flush  10-40 mL Intracatheter Q12H   Continuous Infusions: . amiodarone 60 mg/hr (12/16/18 1213)  . epinephrine 20 mcg/min (12/16/18 1000)  . feeding supplement (VITAL 1.5 CAL)    . fentaNYL infusion INTRAVENOUS 100 mcg/hr (12/16/18 1145)  . heparin 1,200 Units/hr (12/16/18 1000)  . norepinephrine (LEVOPHED) Adult infusion 72 mcg/min (12/16/18 1000)  . piperacillin-tazobactam (ZOSYN)  IV Stopped (12/16/18 0910)  . vasopressin (PITRESSIN) infusion - *FOR SHOCK* 0.04 Units/min (12/16/18 1221)   PRN Meds: acetaminophen **OR** acetaminophen, bisacodyl, docusate, fentaNYL, ipratropium-albuterol, midazolam, sodium chloride flush   Vital Signs    Vitals:   12/16/18 1115 12/16/18 1126 12/16/18 1154 12/16/18 1200  BP: (!) 85/60 (!) 88/47  (!) 86/62  Pulse: (!) 116 (!) 117    Resp: (!) 25 (!) 25  (!) 25  Temp:   98.6 F (37 C)   TempSrc:   Oral   SpO2: (!) 89% 90%    Weight:      Height:        Intake/Output Summary (Last 24 hours) at  12/16/2018 1231 Last data filed at 12/16/2018 1000 Gross per 24 hour  Intake 5118.26 ml  Output 1550 ml  Net 3568.26 ml   Last 3 Weights 12/16/2018 12/14/2018 12/13/2018  Weight (lbs) 192 lb 14.4 oz 183 lb 13.8 oz 182 lb 15.7 oz  Weight (kg) 87.5 kg 83.4 kg 83 kg      Telemetry    Sinus tach 110-120 Personally reviewed   Physical Exam   General:  Intubated critically ill appearing   HEENT: normal + ETT Neck: supple. LIJ TLC. Carotids 2+ bilat; no bruits. No lymphadenopathy or thryomegaly appreciated. Cor: PMI laterally displaced. Tachy regular Lungs: clear Abdomen: soft, nontender, + distended. No hepatosplenomegaly. No bruits or masses. Good bowel sounds. Extremities: warm.  Wrapped 3+ LE edema Neuro: awake on vent    Labs    Chemistry Recent Labs  Lab 12/27/2018 1630  12/15/18 0500  12/15/18 1447 12/15/18 1723 12/16/18 0353 12/16/18 0946  NA 141   < > 136   < > 137 138 135 135  K 4.6   < > 3.3*   < > 3.0* 3.3* 3.6 3.7  CL 108   < > 107  --  107  --  107  --   CO2 14*   < > 18*  --  14*  --  13*  --   GLUCOSE 99   < > 117*  --  133*  --  99  --   BUN 36*   < > 50*  --  52*  --  53*  --   CREATININE 1.58*   < > 1.64*  --  2.28*  --  2.58*  --   CALCIUM 9.0   < > 7.9*  --  7.2*  --  7.0*  --   PROT 6.4*  --   --   --   --   --   --   --   ALBUMIN 2.9*  --   --   --   --   --   --   --   AST 53*  --   --   --   --   --   --   --   ALT <5  --   --   --   --   --   --   --   ALKPHOS 84  --   --   --   --   --   --   --   BILITOT 2.2*  --   --   --   --   --   --   --   GFRNONAA 45*   < > 43*  --  29*  --  25*  --   GFRAA 52*   < > 50*  --  33*  --  29*  --   ANIONGAP 19*   < > 11  --  16*  --  15  --    < > = values in this interval not displayed.     Hematology Recent Labs  Lab 12/14/18 0341  12/15/18 0500  12/15/18 1723 12/16/18 0353 12/16/18 0946  WBC 28.9*  --  7.9  --   --  22.8*  --   RBC 4.53  --  4.41  --   --  4.22  --   HGB 9.3*   < > 9.1*   < > 9.9*  8.6* 9.9*  HCT 31.8*   < > 30.7*   < > 29.0* 29.6* 29.0*  MCV 70.2*  --  69.6*  --   --  70.1*  --   MCH 20.5*  --  20.6*  --   --  20.4*  --   MCHC 29.2*  --  29.6*  --   --  29.1*  --   RDW 21.2*  --  20.6*  --   --  20.6*  --   PLT 197  --  182  --   --  PLATELET CLUMPS NOTED ON SMEAR, COUNT APPEARS DECREASED  --    < > = values in this interval not displayed.    Cardiac Enzymes Recent Labs  Lab 12/13/2018 1630 12/13/2018 2235 12/13/18 0444 12/13/18 1023  TROPONINI 0.09* 0.09* 0.09* 0.08*   No results for input(s): TROPIPOC in the last 168 hours.   BNP Recent Labs  Lab 01/08/2019 1630  BNP 1,859.8*     DDimer No results for input(s): DDIMER in the last 168 hours.   Radiology    Dg Chest Port 1 View  Result Date: 12/15/2018 CLINICAL DATA:  Post intubation. EXAM: PORTABLE CHEST 1 VIEW COMPARISON:  12/14/2018 FINDINGS: Endotracheal tube has tip 8.8 cm above the carina. Nasogastric tube courses into the region of the stomach and off the film as tip is not visualized. Left IJ central venous catheter has tip over the SVC. Persistent opacification over the right mid  to lower lung likely moderate size effusion with associated atelectasis. Improving left base opacification. Improved aeration over the right perihilar region and upper lung. Cardiomediastinal silhouette and remainder of the exam is unchanged. IMPRESSION: Improved aeration over the right mid to upper lung and left lung base possibly improving infection versus asymmetric edema. Persistent opacification over the right mid to lower lung likely moderate size effusion with associated atelectasis. Tubes and lines as described. Electronically Signed   By: Marin Olp M.D.   On: 12/15/2018 10:12   Dg Chest Port 1 View  Result Date: 12/14/2018 CLINICAL DATA:  ET tube EXAM: PORTABLE CHEST 1 VIEW COMPARISON:  12/13/2018 FINDINGS: Moderate to large right pleural effusion and small left pleural effusion. Bilateral airspace disease, right  greater than left. There is cardiomegaly. Left central line remains in place, unchanged. Interval placement endotracheal tube with the tip 8 cm above the carina. NG tube enters the stomach. IMPRESSION: Endotracheal tube 8 cm above the carina. Bilateral effusions and airspace disease, right greater than left, similar to prior study. Electronically Signed   By: Rolm Baptise M.D.   On: 12/14/2018 19:05    Patient Profile     68 y.o. male with PMH of MV replacement, COPD, HLD, CVA and possible ICM who presented with 2 weeks onset of LE edema. Denies any CP. 3+ pitting edema bilaterally. He became hypotensive after IV diuresis.   Assessment & Plan    1. 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 LVEF 50%. MV thickened and appears stenotic. Mean gradient ~20. RV markedly dialted and severely HK.  - initial co-ox 28% with profound cardiogenic shock. Now more septic picture. On high-dose epi, NE and vasopressin. Co-ox 73% - Off lasix for now - Continue supportive care. Titrate pressors as needed.  - given comorbid conditions and severe RV failure doubt he will be candidate for re-do MVR thus long-term options poor  2. Mitral stenosis - s/p bioprosthetic MV 2006 - On echo, peak and mean gradients are 30 and 22 mm Hg respectively. MVA by P 1/2 is 2.86 cm2 (probably inaccurate due to high fillling pressures) By continuity equation, MVA is 0.6 cm 2. - If improves, will need to reassess at cath +/- TEE - With severe RV failure would not be candidate for re-do  3. GNR sepsis with septic shock - Bcx 1/1 samonella species  - cover with broad spectrum abx.  - continue high-dose pressors - at high-risk for endocarditis with MVR  4. Acute hypoxic respiratory failure - due to #1&#3 - now intubated - appreciate CCM care  5. Probable AF/AFL with RVR -continue amio/heparin. Can decrease amio to 30/hr  6. AKI - due to ATN/shock. Follow closely  7.  Disposition - multiple discussions with his sister. I conveyed that I did not think he would survive. They want to continue Full Code for now.   CRITICAL CARE Performed by: Glori Bickers  Total critical care time: 35 minutes  Critical care time was exclusive of separately billable procedures and treating other patients.  Critical care was necessary to treat or prevent imminent or life-threatening deterioration.  Critical care was time spent personally by me (independent of midlevel providers or residents) on the following activities: development of treatment plan with patient and/or surrogate as well as nursing, discussions with consultants, evaluation of patient's response to treatment, examination of patient, obtaining history from patient or surrogate, ordering and performing treatments and interventions, ordering and review of  laboratory studies, ordering and review of radiographic studies, pulse oximetry and re-evaluation of patient's condition.    For questions or updates, please contact [place] Please consult [email or web address] for contact info under       Signed, Glori Bickers, MD  12/16/2018, 12:31 PM

## 2018-12-16 NOTE — Progress Notes (Signed)
eLink Physician-Brief Progress Note Patient Name: Daniel Price DOB: 1951-03-24 MRN: [id number]   Date of Service  12/16/2018  HPI/Events of Note  Cardiogenic + septic shock. Hypotension currently likely due to very high afterload in the face of severe biventricular dysfunction, additive causes may include metabolic acidosis, hypocalcemia and relative adrenal insufficiency.  eICU Interventions  Begin low dose Dopamine infusion given that patient is already tachycardic (goal is to augment inotropic support), attempt to wean Levophed slowly as tolerated to reduce afterload, start stress dose steroids, Calcium gluconate 2 gm iv x 1 for ionized calcium of 1.05, check ABG to exclude worsening acidosis contributing to hypotension.        Thomasene Lot Ogan 12/16/2018, 11:24 PM

## 2018-12-17 ENCOUNTER — Inpatient Hospital Stay (HOSPITAL_COMMUNITY): Payer: Medicare Other

## 2018-12-17 DIAGNOSIS — N179 Acute kidney failure, unspecified: Secondary | ICD-10-CM

## 2018-12-17 LAB — POCT I-STAT 7, (LYTES, BLD GAS, ICA,H+H)
Acid-base deficit: 12 mmol/L — ABNORMAL HIGH (ref 0.0–2.0)
Bicarbonate: 13.7 mmol/L — ABNORMAL LOW (ref 20.0–28.0)
Calcium, Ion: 0.99 mmol/L — ABNORMAL LOW (ref 1.15–1.40)
HCT: 28 % — ABNORMAL LOW (ref 39.0–52.0)
Hemoglobin: 9.5 g/dL — ABNORMAL LOW (ref 13.0–17.0)
O2 Saturation: 92 %
Patient temperature: 37.2
Potassium: 4 mmol/L (ref 3.5–5.1)
Sodium: 133 mmol/L — ABNORMAL LOW (ref 135–145)
TCO2: 15 mmol/L — ABNORMAL LOW (ref 22–32)
pCO2 arterial: 29.5 mmHg — ABNORMAL LOW (ref 32.0–48.0)
pH, Arterial: 7.276 — ABNORMAL LOW (ref 7.350–7.450)
pO2, Arterial: 71 mmHg — ABNORMAL LOW (ref 83.0–108.0)

## 2018-12-17 LAB — CULTURE, BLOOD (ROUTINE X 2): Special Requests: ADEQUATE

## 2018-12-17 LAB — CBC
HCT: 27.5 % — ABNORMAL LOW (ref 39.0–52.0)
Hemoglobin: 8.3 g/dL — ABNORMAL LOW (ref 13.0–17.0)
MCH: 20.5 pg — ABNORMAL LOW (ref 26.0–34.0)
MCHC: 30.2 g/dL (ref 30.0–36.0)
MCV: 67.9 fL — ABNORMAL LOW (ref 80.0–100.0)
Platelets: 102 10*3/uL — ABNORMAL LOW (ref 150–400)
RBC: 4.05 MIL/uL — ABNORMAL LOW (ref 4.22–5.81)
RDW: 21.2 % — ABNORMAL HIGH (ref 11.5–15.5)
WBC: 37.8 10*3/uL — ABNORMAL HIGH (ref 4.0–10.5)
nRBC: 3 % — ABNORMAL HIGH (ref 0.0–0.2)

## 2018-12-17 LAB — GLUCOSE, CAPILLARY
Glucose-Capillary: 104 mg/dL — ABNORMAL HIGH (ref 70–99)
Glucose-Capillary: 105 mg/dL — ABNORMAL HIGH (ref 70–99)
Glucose-Capillary: 118 mg/dL — ABNORMAL HIGH (ref 70–99)
Glucose-Capillary: 126 mg/dL — ABNORMAL HIGH (ref 70–99)
Glucose-Capillary: 140 mg/dL — ABNORMAL HIGH (ref 70–99)
Glucose-Capillary: 97 mg/dL (ref 70–99)

## 2018-12-17 LAB — COMPREHENSIVE METABOLIC PANEL
ALT: 37 U/L (ref 0–44)
AST: 66 U/L — ABNORMAL HIGH (ref 15–41)
Albumin: 1.5 g/dL — ABNORMAL LOW (ref 3.5–5.0)
Alkaline Phosphatase: 126 U/L (ref 38–126)
Anion gap: 18 — ABNORMAL HIGH (ref 5–15)
BUN: 57 mg/dL — ABNORMAL HIGH (ref 8–23)
CO2: 14 mmol/L — ABNORMAL LOW (ref 22–32)
Calcium: 7.1 mg/dL — ABNORMAL LOW (ref 8.9–10.3)
Chloride: 102 mmol/L (ref 98–111)
Creatinine, Ser: 3.28 mg/dL — ABNORMAL HIGH (ref 0.61–1.24)
GFR calc Af Amer: 22 mL/min — ABNORMAL LOW (ref >60)
GFR calc non Af Amer: 19 mL/min — ABNORMAL LOW (ref >60)
Glucose, Bld: 97 mg/dL (ref 70–99)
Potassium: 4 mmol/L (ref 3.5–5.1)
Sodium: 134 mmol/L — ABNORMAL LOW (ref 135–145)
Total Bilirubin: 1.8 mg/dL — ABNORMAL HIGH (ref 0.3–1.2)
Total Protein: 4.9 g/dL — ABNORMAL LOW (ref 6.5–8.1)

## 2018-12-17 LAB — PATHOLOGIST SMEAR REVIEW

## 2018-12-17 LAB — COOXEMETRY PANEL
Carboxyhemoglobin: 1.8 % — ABNORMAL HIGH (ref 0.5–1.5)
Methemoglobin: 1.9 % — ABNORMAL HIGH (ref 0.0–1.5)
O2 Saturation: 74.9 %
Total hemoglobin: 8.4 g/dL — ABNORMAL LOW (ref 12.0–16.0)

## 2018-12-17 LAB — DIC (DISSEMINATED INTRAVASCULAR COAGULATION) PANEL: D-Dimer, Quant: 6.59 ug/mL-FEU — ABNORMAL HIGH (ref 0.00–0.50)

## 2018-12-17 LAB — DIC (DISSEMINATED INTRAVASCULAR COAGULATION)PANEL
Fibrinogen: >800 mg/dL — ABNORMAL HIGH (ref 210–475)
INR: 1.5 — ABNORMAL HIGH (ref 0.8–1.2)
Platelets: 84 10*3/uL — ABNORMAL LOW (ref 150–400)
Prothrombin Time: 18.2 seconds — ABNORMAL HIGH (ref 11.4–15.2)
aPTT: 123 seconds — ABNORMAL HIGH (ref 24–36)

## 2018-12-17 LAB — MAGNESIUM: Magnesium: 2.1 mg/dL (ref 1.7–2.4)

## 2018-12-17 LAB — HEPARIN LEVEL (UNFRACTIONATED): Heparin Unfractionated: 0.29 IU/mL — ABNORMAL LOW (ref 0.30–0.70)

## 2018-12-17 LAB — PHOSPHORUS: Phosphorus: 5.4 mg/dL — ABNORMAL HIGH (ref 2.5–4.6)

## 2018-12-17 MED ORDER — FUROSEMIDE 10 MG/ML IJ SOLN
80.0000 mg | Freq: Once | INTRAMUSCULAR | Status: AC
  Administered 2018-12-17: 80 mg via INTRAVENOUS
  Filled 2018-12-17: qty 8

## 2018-12-17 NOTE — Progress Notes (Signed)
NAME:  Daniel Price, MRN:  [id number], DOB:  30-Dec-1950, LOS: 5 ADMISSION DATE:  Jan 08, 2019, CONSULTATION DATE:  12/14/2018 REFERRING MD:  Dr. Gala Romney, Cardiology, CHIEF COMPLAINT:  Short of breath   Brief History   68 yr old male smoker presented with 3 days of dyspnea, wheeze, and leg swelling.  Treated for CHF exacerbation.  Required intubation 5/3  for respiratory support.  Past Medical History  COPD, MV replacement, CHF, CVA, HTN, CAD, Substance abuse  Significant Hospital Events   5/01 Admit 5/03 Intubated  Consults:  Cardiology  Procedures:  Lt IJ CVL 5/02 >> ETT 5/03 >>  Significant Diagnostic Tests:  Echo 5/02 >> EF 45 to 50%, septal flattening, severe RV systolic dysfx, mod pericardial effusion  Micro Data:  COVID 5/01 >> negative BCx 5/03 >> Salmonella (pan sensitive ampicillin/levo/bactrim)  Trach asp 5/4>> BCx 2 5/3 >>Salmonella  C Diff neg 5/5    Antimicrobials:  Ceftriaxone 5/4>5/5 Vanc 5/4>5/5 Zosyn 5/4>   Interim history/subjective:  Remains on multiple pressors /inotrope support  Final BC positive for Salmonella  CVP 13-10  +11 L I/O Bal since admit, wt up 10kg since admit  Fevers persist , wbc tr up (38K)  Worsening scr and decreased UOP   CXR no sign change with Mod right Pl effusion/atx, LLL atx/infiltrate , edema  Co Ox 78  Objective   Blood pressure 99/71, pulse (!) 123, temperature 100.3 F (37.9 C), temperature source Oral, resp. rate (!) 22, height 6\' 2"  (1.88 m), weight 93 kg, SpO2 (!) 89 %. CVP:  [6 mmHg-13 mmHg] 13 mmHg  Vent Mode: PRVC FiO2 (%):  [40 %-60 %] 40 % Set Rate:  [22 bmp-25 bmp] 22 bmp Vt Set:  [600 mL-650 mL] 650 mL PEEP:  [10 cmH20] 10 cmH20 Plateau Pressure:  [19 cmH20-25 cmH20] 20 cmH20   Intake/Output Summary (Last 24 hours) at 12/17/2018 0816 Last data filed at 12/17/2018 5465 Gross per 24 hour  Intake 6147.09 ml  Output 250 ml  Net 5897.09 ml   Filed Weights   12/14/18 0349 12/16/18 0500 12/17/18 0500   Weight: 83.4 kg 87.5 kg 93 kg   Physical Exam: General: Chronically ill-appearing male, sedated, calm  HENT: [place], AT, ETT secure and intact, no LAD  Respiratory: Bilateral chest excursion, coarse breath sounds, diminished breath sounds in the bases  Cardiovascular: Tachycardia, no MRG  GI: Bowel sounds positive, soft, flexiseal, tube feeds Extremities: 3+ pitting edema, lower extremities wrapped  Neuro: Sedated, follows intermittent commands per RN GU: Condom cath in place minimum urine output   Chest x-ray 5/6 -no significant change, moderate right pleural effusion with atelectasis, left atelectasis/infiltrate, interstitial edema  Resolved Hospital Problem list     Assessment & Plan:   Acute hypoxic respiratory failure. COPD. COVID-19 negative Self extubated 5/4 requiring re-intubation  Plan - Full vent support - ABG  prn - Wean PEEP as able - Hold diuresis for now as pressor dependent - BD prn  - CXR in am and prn  Septic Shock secondary Salmonella Bacteremia. Plan -Continue Zosyn - IVF  -Wean pressor support for MAP goal >65 -  Repeat BCx for clearance when indicated - Trend lactate -Stress dose steroids 5/5  Acute on chronic diastolic CHF with RV failure. Cardiogenic shock. Mitral stenosis s/p bioprosthetic MV. A fib with RVR. Plan - Amiodarone for AFRVR - Inotropic and pressor support  - Continue  vasopressin - F/u CVP, cooximetry  Renal>> AKI -Worsening serum creatinine with decreased urine output  P Monitor BMET and UO Avoid nephrotoxic medications Maintain renal perfusion Bladder scan prn Consider renal consult as will most likely need CVVHD Trial of diuresis today 5/6  per cardiology  Nutrition  P Continue Trickle feeds   Hypokalemia. Plan - Daily BMET - Replete K prn  Anemia of critical illness, iron deficiency and chronic disease. Heparin gtt Plan - f/u CBC - transfuse for Hb < 7 - Monitor for obvious signs of bleeding  Goals  of Care conversation has been initiated by Dr. Gala Romney with sisters. Family request full code with aggressive treatment   Best practice:  Diet: NPO/tube feeds DVT prophylaxis: heparin gtt GI prophylaxis: protonix Mobility: bed rest Code Status: full code Disposition: ICU  Labs   CBC: Recent Labs  Lab 12/20/2018 1630  12/13/18 0444  12/14/18 0341  12/15/18 0500  12/15/18 1723 12/16/18 0353 12/16/18 0946 12/16/18 1336 12/17/18 0408  WBC 26.5*  --  21.8*  --  28.9*  --  7.9  --   --  22.8*  --   --  37.8*  NEUTROABS 24.5*  --   --   --   --   --   --   --   --   --   --   --   --   HGB 10.4*   < > 9.3*   < > 9.3*   < > 9.1*   < > 9.9* 8.6* 9.9* 9.5* 8.3*  HCT 38.3*   < > 33.1*   < > 31.8*   < > 30.7*   < > 29.0* 29.6* 29.0* 28.0* 27.5*  MCV 76.3*  --  71.8*  --  70.2*  --  69.6*  --   --  70.1*  --   --  67.9*  PLT 219  --  155  --  197  --  182  --   --  PLATELET CLUMPS NOTED ON SMEAR, COUNT APPEARS DECREASED  --   --  PENDING   < > = values in this interval not displayed.    Basic Metabolic Panel: Recent Labs  Lab 12/14/18 2035  12/15/18 0500  12/15/18 1447 12/15/18 1723 12/16/18 0353 12/16/18 0946 12/16/18 1336 12/17/18 0408  NA 136   < > 136   < > 137 138 135 135 134* 134*  K 2.6*   < > 3.3*   < > 3.0* 3.3* 3.6 3.7 3.6 4.0  CL 104  --  107  --  107  --  107  --   --  102  CO2 19*  --  18*  --  14*  --  13*  --   --  14*  GLUCOSE 165*  --  117*  --  133*  --  99  --   --  97  BUN 46*  --  50*  --  52*  --  53*  --   --  57*  CREATININE 1.58*  --  1.64*  --  2.28*  --  2.58*  --   --  3.28*  CALCIUM 8.2*  --  7.9*  --  7.2*  --  7.0*  --   --  7.1*  MG 2.1  --  1.8  --  2.3  --  2.1  --   --  2.1  PHOS 4.8*  --  3.8  --  3.9  --  4.6  --   --  5.4*   < > = values in this  interval not displayed.   GFR: Estimated Creatinine Clearance: 25.8 mL/min (A) (by C-G formula based on SCr of 3.28 mg/dL (H)). Recent Labs  Lab 12/14/18 0341 12/14/18 0544 12/14/18 0820  12/15/18 0500 12/16/18 0353 12/16/18 1645 12/17/18 0408  WBC 28.9*  --   --  7.9 22.8*  --  37.8*  LATICACIDVEN  --  2.1* 2.0*  --   --  2.8*  --     Liver Function Tests: Recent Labs  Lab 01/03/2019 1630 12/17/18 0408  AST 53* 66*  ALT <5 37  ALKPHOS 84 126  BILITOT 2.2* 1.8*  PROT 6.4* 4.9*  ALBUMIN 2.9* 1.5*   No results for input(s): LIPASE, AMYLASE in the last 168 hours. No results for input(s): AMMONIA in the last 168 hours.  ABG    Component Value Date/Time   PHART 7.328 (L) 12/16/2018 2318   PCO2ART 29.5 (L) 12/16/2018 2318   PO2ART 74.7 (L) 12/16/2018 2318   HCO3 15.1 (L) 12/16/2018 2318   TCO2 15 (L) 12/16/2018 1336   ACIDBASEDEF 9.7 (H) 12/16/2018 2318   O2SAT 74.9 12/17/2018 0450     Coagulation Profile: No results for input(s): INR, PROTIME in the last 168 hours.  Cardiac Enzymes: Recent Labs  Lab 01/08/2019 1630 12/29/2018 2235 12/13/18 0444 12/13/18 1023  TROPONINI 0.09* 0.09* 0.09* 0.08*    HbA1C: No results found for: HGBA1C  CBG: Recent Labs  Lab 12/16/18 1204 12/16/18 1613 12/16/18 1955 12/17/18 0028 12/17/18 0412  GLUCAP 83 87 83 97 104*    Critical care time: 45 min       Jo Booze NP-C  [facility]  [phone]  12/17/2018   12/17/2018 8:16 AM

## 2018-12-17 NOTE — Progress Notes (Signed)
ANTICOAGULATION CONSULT NOTE - Follow Up Consult  Pharmacy Consult for heparin Indication: atrial fibrillation  No Known Allergies  Patient Measurements: Height: 6\' 2"  (188 cm) Weight: 205 lb 0.4 oz (93 kg) IBW/kg (Calculated) : 82.2 Heparin Dosing Weight: 83  Vital Signs: Temp: 100.3 F (37.9 C) (05/06 0400) Temp Source: Oral (05/06 0400) BP: 97/71 (05/06 0500) Pulse Rate: 123 (05/06 0500)  Labs: Recent Labs    12/15/18 0500  12/15/18 1447  12/16/18 0353 12/16/18 0449 12/16/18 0946 12/16/18 1336 12/17/18 0408  HGB 9.1*   < >  --    < > 8.6*  --  9.9* 9.5*  --   HCT 30.7*   < >  --    < > 29.6*  --  29.0* 28.0*  --   PLT 182  --   --   --  PLATELET CLUMPS NOTED ON SMEAR, COUNT APPEARS DECREASED  --   --   --   --   HEPARINUNFRC 0.39  --  0.43  --   --  0.30  --   --  0.29*  CREATININE 1.64*  --  2.28*  --  2.58*  --   --   --  3.28*   < > = values in this interval not displayed.    Estimated Creatinine Clearance: 25.8 mL/min (A) (by C-G formula based on SCr of 3.28 mg/dL (H)).   Infusion medications: . amiodarone 30 mg/hr (12/17/18 0500)  . DOPamine 5 mcg/kg/min (12/17/18 0500)  . epinephrine 20 mcg/min (12/17/18 0500)  . feeding supplement (VITAL 1.5 CAL) 1,000 mL (12/16/18 1348)  . fentaNYL infusion INTRAVENOUS 100 mcg/hr (12/17/18 0500)  . heparin 1,250 Units/hr (12/17/18 0500)  . norepinephrine (LEVOPHED) Adult infusion 90 mcg/min (12/17/18 0500)  . piperacillin-tazobactam (ZOSYN)  IV Stopped (12/17/18 0233)  . vasopressin (PITRESSIN) infusion - *FOR SHOCK* 0.04 Units/min (12/17/18 0500)    Assessment: 37 yoM admitted with severe CHF started on inotropic support. Pt noted to have AFib, pharmacy asked to start IV heparin.  Heparin level subtherapeutic at 0.29 on 1250 units/hr. Hgb down to 8.6, SCR trending up, no bleeding reported.   Goal of Therapy:  Heparin level 0.3-0.7 units/ml Monitor platelets by anticoagulation protocol: Yes   Plan:  Increase  heparin drip at 1400 units/hr  Heparin level in 8 hours Daily heparin level, CBC, s/sx bleeding   Lenward Chancellor, PharmD PGY1 Pharmacy Resident 12/17/2018 6:27 AM  **Pharmacist phone directory can now be found on [email or web address] listed under [facility]**

## 2018-12-17 NOTE — Progress Notes (Addendum)
Progress Note  Patient Name: Daniel Price Date of Encounter: 12/17/2018  Primary Cardiologist: [facility]  Subjective   Initial co-ox 28% in setting of severe RV failure.  Developed profound GNR sepsis 5/4.   Remains intubate, Remains on vasopressin, epi 20 and NE 70. SBP 80-90s.Co-ox 75% Awake on vent. On amio at 20 for AF. Renal function worse. Creatinine up to 3.3. Minimal urine output. Weight up 20 pounds  Bcx 1/1 samonella species. F/u cx negative. C. Diff negative   Echo performed. LVEF ok. MV thickened and appears stenotic. Mean gradient ~20. RV markedly dialted and severely HK.   Inpatient Medications    Scheduled Meds:  atorvastatin  40 mg Per Tube q1800   chlorhexidine gluconate (MEDLINE KIT)  15 mL Mouth Rinse BID   Chlorhexidine Gluconate Cloth  6 each Topical Daily   clopidogrel  75 mg Per Tube QPM   fentaNYL (SUBLIMAZE) injection  50 mcg Intravenous Once   hydrocortisone sod succinate (SOLU-CORTEF) inj  100 mg Intravenous Q6H   mouth rinse  15 mL Mouth Rinse 10 times per day   pantoprazole sodium  40 mg Per Tube Q24H   sodium chloride flush  10-40 mL Intracatheter Q12H   Continuous Infusions:  amiodarone 30 mg/hr (12/17/18 0800)   DOPamine 5 mcg/kg/min (12/17/18 0800)   epinephrine 20 mcg/min (12/17/18 0800)   feeding supplement (VITAL 1.5 CAL) 1,000 mL (12/16/18 1348)   fentaNYL infusion INTRAVENOUS 100 mcg/hr (12/17/18 0800)   heparin 1,400 Units/hr (12/17/18 0800)   norepinephrine (LEVOPHED) Adult infusion 70 mcg/min (12/17/18 0814)   piperacillin-tazobactam (ZOSYN)  IV 12.5 mL/hr at 12/17/18 0800   vasopressin (PITRESSIN) infusion - *FOR SHOCK* 0.04 Units/min (12/17/18 0800)   PRN Meds: acetaminophen **OR** acetaminophen, bisacodyl, docusate, fentaNYL, ipratropium-albuterol, midazolam, sodium chloride flush   Vital Signs    Vitals:   12/17/18 0400 12/17/18 0416 12/17/18 0500 12/17/18 0600  BP: 103/74  97/71 99/71  Pulse: (!)  121  (!) 123   Resp: (!) 22  (!) 22 (!) 22  Temp: 100.3 F (37.9 C)     TempSrc: Oral     SpO2: 100% 96% (!) 89%   Weight:   93 kg   Height:        Intake/Output Summary (Last 24 hours) at 12/17/2018 0824 Last data filed at 12/17/2018 0800 Gross per 24 hour  Intake 6418.49 ml  Output 250 ml  Net 6168.49 ml   Last 3 Weights 12/17/2018 12/16/2018 12/14/2018  Weight (lbs) 205 lb 0.4 oz 192 lb 14.4 oz 183 lb 13.8 oz  Weight (kg) 93 kg 87.5 kg 83.4 kg      Telemetry    Sinus tach 110-120s Personally reviewed   Physical Exam   General:  Ill appearing. Intubated HEENT: normal Neck: supple. JVP to ear. Carotids 2+ bilat; no bruits. No lymphadenopathy or thryomegaly appreciated. Cor: PMI laterally displaced. Tachy Regular rate & rhythm. 2/6 MR/TR Lungs: clear Abdomen: soft, nontender, + distended. No hepatosplenomegaly. No bruits or masses. Good bowel sounds. Extremities: no cyanosis, clubbing, rash, 4+ edema in UE and LE (wrapped). Petechial rash on right thigh Neuro: awake on vent    Labs    Chemistry Recent Labs  Lab 12/16/2018 1630  12/15/18 1447  12/16/18 0353 12/16/18 0946 12/16/18 1336 12/17/18 0408  NA 141   < > 137   < > 135 135 134* 134*  K 4.6   < > 3.0*   < > 3.6 3.7 3.6 4.0  CL  108   < > 107  --  107  --   --  102  CO2 14*   < > 14*  --  13*  --   --  14*  GLUCOSE 99   < > 133*  --  99  --   --  97  BUN 36*   < > 52*  --  53*  --   --  57*  CREATININE 1.58*   < > 2.28*  --  2.58*  --   --  3.28*  CALCIUM 9.0   < > 7.2*  --  7.0*  --   --  7.1*  PROT 6.4*  --   --   --   --   --   --  4.9*  ALBUMIN 2.9*  --   --   --   --   --   --  1.5*  AST 53*  --   --   --   --   --   --  66*  ALT <5  --   --   --   --   --   --  37  ALKPHOS 84  --   --   --   --   --   --  126  BILITOT 2.2*  --   --   --   --   --   --  1.8*  GFRNONAA 45*   < > 29*  --  25*  --   --  19*  GFRAA 52*   < > 33*  --  29*  --   --  22*  ANIONGAP 19*   < > 16*  --  15  --   --  18*   < > =  values in this interval not displayed.     Hematology Recent Labs  Lab 12/15/18 0500  12/16/18 0353 12/16/18 0946 12/16/18 1336 12/17/18 0408  WBC 7.9  --  22.8*  --   --  37.8*  RBC 4.41  --  4.22  --   --  4.05*  HGB 9.1*   < > 8.6* 9.9* 9.5* 8.3*  HCT 30.7*   < > 29.6* 29.0* 28.0* 27.5*  MCV 69.6*  --  70.1*  --   --  67.9*  MCH 20.6*  --  20.4*  --   --  20.5*  MCHC 29.6*  --  29.1*  --   --  30.2  RDW 20.6*  --  20.6*  --   --  21.2*  PLT 182  --  PLATELET CLUMPS NOTED ON SMEAR, COUNT APPEARS DECREASED  --   --  PENDING   < > = values in this interval not displayed.    Cardiac Enzymes Recent Labs  Lab 01/01/2019 1630 01/03/2019 2235 12/13/18 0444 12/13/18 1023  TROPONINI 0.09* 0.09* 0.09* 0.08*   No results for input(s): TROPIPOC in the last 168 hours.   BNP Recent Labs  Lab 12/31/2018 1630  BNP 1,859.8*     DDimer No results for input(s): DDIMER in the last 168 hours.   Radiology    Dg Chest Port 1 View  Result Date: 12/17/2018 CLINICAL DATA:  68 year old male status post mitral valve replacement with respiratory failure EXAM: PORTABLE CHEST 1 VIEW COMPARISON:  Prior chest x-ray 12/15/2018 FINDINGS: The tip of the endotracheal tube is 5.3 cm above the carina. Left IJ central venous catheter in stable position with the tip overlying the lower SVC.  Gastric tube is present. The tip is not identified but lies below the diaphragm, presumably within the stomach. External defibrillator pads project over the chest. Patient is status post median sternotomy. Stable cardiac and mediastinal contours. Persistent moderate right pleural effusion with associated right basilar atelectasis. Mild pulmonary vascular congestion bordering on mild interstitial edema remains unchanged. Left retrocardiac opacity may reflect atelectasis or infiltrate. No pneumothorax. Overall, no significant interval change in the appearance of the chest. IMPRESSION: 1. Overall, no significant interval change in  the appearance of the chest compared to 12/15/2018. 2. Persistent moderate layering right pleural effusion with associated right basilar atelectasis. 3. Persistent left retrocardiac airspace opacity which may reflect atelectasis and/or infiltrate. Smaller left effusion not excluded. 4. Pulmonary vascular congestion bordering on mild interstitial edema. 5. Stable and satisfactory support apparatus. Electronically Signed   By: Jacqulynn Cadet M.D.   On: 12/17/2018 08:12   Dg Chest Port 1 View  Result Date: 12/15/2018 CLINICAL DATA:  Post intubation. EXAM: PORTABLE CHEST 1 VIEW COMPARISON:  12/14/2018 FINDINGS: Endotracheal tube has tip 8.8 cm above the carina. Nasogastric tube courses into the region of the stomach and off the film as tip is not visualized. Left IJ central venous catheter has tip over the SVC. Persistent opacification over the right mid to lower lung likely moderate size effusion with associated atelectasis. Improving left base opacification. Improved aeration over the right perihilar region and upper lung. Cardiomediastinal silhouette and remainder of the exam is unchanged. IMPRESSION: Improved aeration over the right mid to upper lung and left lung base possibly improving infection versus asymmetric edema. Persistent opacification over the right mid to lower lung likely moderate size effusion with associated atelectasis. Tubes and lines as described. Electronically Signed   By: Marin Olp M.D.   On: 12/15/2018 10:12    Patient Profile     68 y.o. male with PMH of MV replacement, COPD, HLD, CVA and possible ICM who presented with 2 weeks onset of LE edema. Denies any CP. 3+ pitting edema bilaterally. He became hypotensive after IV diuresis.   Assessment & Plan    1. 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 LVEF 50%. MV thickened and appears stenotic. Mean gradient ~20. RV markedly dialted and severely HK.  - initial co-ox 28%  with profound cardiogenic shock. Now more septic picture. On high-dose epi, NE and vasopressin. Co-ox 75% - Off lasix for now. Volume status way up in setting of resuscitation. Has developed AKI. Will give lasix trial this am  if BP stabilizes. May need CVVHD - Continue supportive care. Titrate pressors as needed.  - given comorbid conditions and severe RV failure doubt he will be candidate for re-do MVR thus long-term options poor  2. Mitral stenosis - s/p bioprosthetic MV 2006 - On echo, peak and mean gradients are 30 and 22 mm Hg respectively. MVA by P 1/2 is 2.86 cm2 (probably inaccurate due to high fillling pressures) By continuity equation, MVA is 0.6 cm 2. - If improves, will need to reassess at cath +/- TEE - With severe RV failure would not be candidate for re-do  3. GNR sepsis with septic shock - Bcx 1/1 samonella species. Likely GI source (presented with diarrhea) - cover with zosyn - continue high-dose pressors - at high-risk for endocarditis with MVR  4. Acute hypoxic respiratory failure - due to #1&#3 - now intubated - appreciate CCM care  5. Probable AF/AFL with RVR -continue amio/heparin. Can  decrease amio to 30/hr  6. AKI - creatinine continues to climb due to ATN/shock. Follow closely - suspect he will need CVVHD  7. Disposition - multiple discussions with his sister. I conveyed that I did not think he would survive. They want to continue Full Code for now.   CRITICAL CARE Performed by: Glori Bickers  Total critical care time: 45 minutes  Critical care time was exclusive of separately billable procedures and treating other patients.  Critical care was necessary to treat or prevent imminent or life-threatening deterioration.  Critical care was time spent personally by me (independent of midlevel providers or residents) on the following activities: development of treatment plan with patient and/or surrogate as well as nursing, discussions with  consultants, evaluation of patient's response to treatment, examination of patient, obtaining history from patient or surrogate, ordering and performing treatments and interventions, ordering and review of laboratory studies, ordering and review of radiographic studies, pulse oximetry and re-evaluation of patient's condition.    For questions or updates, please contact [place] Please consult [email or web address] for contact info under       Signed, Glori Bickers, MD  12/17/2018, 8:24 AM

## 2018-12-17 NOTE — Progress Notes (Signed)
Evaluated for thrombocytopenia. Patient assessed with moderate risk for HIT. HIT ab ordered. Requested alternative anticoagulation with Pharmacy. Pharmacy discussed anticoagulation with Cardiology who recommend continuing heparin at this time.

## 2018-12-17 NOTE — Progress Notes (Signed)
Assisted tele visit to patient with niece.  Daniel Milich M Emanuel Dowson, RN   

## 2018-12-17 NOTE — Progress Notes (Signed)
Pharmacy Heparin Induced Thrombocytopenia (HIT) Note:  Daniel Price is an 68 y.o. male being evaluated for HIT. Heparin was started 12/26/2018 for Afib, and baseline platelets were 219.   HIT labs were ordered on 5/6 when platelets dropped to 84.  Auto-populate labs: No results found for: HEPINDPLTAB, SRALOWDOSEHP, SRAHIGHDOSEH    CALCULATE SCORE:  4Ts (see the HIT Algorithm) Score  Thrombocytopenia 2  Timing 2  Thrombosis 0  Other causes of thrombocytopenia 0  Total 4     Recommendations (A or B or C) are based on available lab results:  A. No lab results available (HIT antibody and/or SRA ordered): -Moderate HIT probability: HIT antibody recommended; SRA may be considered, consider alternative anticoagulation; document heparin allergy  B. HIT antibody result available: -N/A - HIT antibody not available, and/or SRA not available  C. SRA result available -SRA not available  Name of MD Contacted: Dr. Everardo All  Plan (Discussed with provider) Labs ordered: -Heparin antibody Heparin allergy: -documented or updated Anticoagulation plans - continuing heparin and follow platelet trend in AM   Lenward Chancellor, PharmD PGY1 Pharmacy Resident 12/17/2018, 2:46 PM

## 2018-12-18 ENCOUNTER — Telehealth: Payer: Self-pay | Admitting: *Deleted

## 2018-12-18 ENCOUNTER — Inpatient Hospital Stay (HOSPITAL_COMMUNITY): Payer: Medicare Other

## 2018-12-18 LAB — GLUCOSE, CAPILLARY
Glucose-Capillary: 68 mg/dL — ABNORMAL LOW (ref 70–99)
Glucose-Capillary: 78 mg/dL (ref 70–99)
Glucose-Capillary: 74 mg/dL (ref 70–99)
Glucose-Capillary: 93 mg/dL (ref 70–99)

## 2018-12-18 LAB — BASIC METABOLIC PANEL
Anion gap: 23 — ABNORMAL HIGH (ref 5–15)
BUN: 61 mg/dL — ABNORMAL HIGH (ref 8–23)
CO2: 7 mmol/L — ABNORMAL LOW (ref 22–32)
Calcium: 7.3 mg/dL — ABNORMAL LOW (ref 8.9–10.3)
Chloride: 101 mmol/L (ref 98–111)
Creatinine, Ser: 3.84 mg/dL — ABNORMAL HIGH (ref 0.61–1.24)
GFR calc Af Amer: 18 mL/min — ABNORMAL LOW (ref >60)
GFR calc non Af Amer: 15 mL/min — ABNORMAL LOW (ref >60)
Glucose, Bld: 75 mg/dL (ref 70–99)
Potassium: 5.1 mmol/L (ref 3.5–5.1)
Sodium: 131 mmol/L — ABNORMAL LOW (ref 135–145)

## 2018-12-18 LAB — POCT I-STAT 7, (LYTES, BLD GAS, ICA,H+H)
Acid-base deficit: 21 mmol/L — ABNORMAL HIGH (ref 0.0–2.0)
Acid-base deficit: 21 mmol/L — ABNORMAL HIGH (ref 0.0–2.0)
Acid-base deficit: 21 mmol/L — ABNORMAL HIGH (ref 0.0–2.0)
Bicarbonate: 7.7 mmol/L — ABNORMAL LOW (ref 20.0–28.0)
Bicarbonate: 7.6 mmol/L — ABNORMAL LOW (ref 20.0–28.0)
Bicarbonate: 7.7 mmol/L — ABNORMAL LOW (ref 20.0–28.0)
Calcium, Ion: 0.91 mmol/L — ABNORMAL LOW (ref 1.15–1.40)
Calcium, Ion: 1 mmol/L — ABNORMAL LOW (ref 1.15–1.40)
Calcium, Ion: 0.9 mmol/L — ABNORMAL LOW (ref 1.15–1.40)
HCT: 26 % — ABNORMAL LOW (ref 39.0–52.0)
HCT: 25 % — ABNORMAL LOW (ref 39.0–52.0)
HCT: 23 % — ABNORMAL LOW (ref 39.0–52.0)
Hemoglobin: 8.8 g/dL — ABNORMAL LOW (ref 13.0–17.0)
Hemoglobin: 8.5 g/dL — ABNORMAL LOW (ref 13.0–17.0)
Hemoglobin: 7.8 g/dL — ABNORMAL LOW (ref 13.0–17.0)
O2 Saturation: 94 %
O2 Saturation: 94 %
O2 Saturation: 91 %
Patient temperature: 36
Patient temperature: 35.6
Patient temperature: 35.8
Potassium: 5.1 mmol/L (ref 3.5–5.1)
Potassium: 5.1 mmol/L (ref 3.5–5.1)
Potassium: 5.4 mmol/L — ABNORMAL HIGH (ref 3.5–5.1)
Sodium: 130 mmol/L — ABNORMAL LOW (ref 135–145)
Sodium: 130 mmol/L — ABNORMAL LOW (ref 135–145)
Sodium: 130 mmol/L — ABNORMAL LOW (ref 135–145)
TCO2: 9 mmol/L — ABNORMAL LOW (ref 22–32)
TCO2: 8 mmol/L — ABNORMAL LOW (ref 22–32)
TCO2: 8 mmol/L — ABNORMAL LOW (ref 22–32)
pCO2 arterial: 25.3 mmHg — ABNORMAL LOW (ref 32.0–48.0)
pCO2 arterial: 24.7 mmHg — ABNORMAL LOW (ref 32.0–48.0)
pCO2 arterial: 25 mmHg — ABNORMAL LOW (ref 32.0–48.0)
pH, Arterial: 7.087 — CL (ref 7.350–7.450)
pH, Arterial: 7.089 — CL (ref 7.350–7.450)
pH, Arterial: 7.089 — CL (ref 7.350–7.450)
pO2, Arterial: 92 mmHg (ref 83.0–108.0)
pO2, Arterial: 91 mmHg (ref 83.0–108.0)
pO2, Arterial: 78 mmHg — ABNORMAL LOW (ref 83.0–108.0)

## 2018-12-18 LAB — COOXEMETRY PANEL
Carboxyhemoglobin: 1.9 % — ABNORMAL HIGH (ref 0.5–1.5)
Methemoglobin: 1.5 % (ref 0.0–1.5)
O2 Saturation: 79.7 %
Total hemoglobin: 7.3 g/dL — ABNORMAL LOW (ref 12.0–16.0)

## 2018-12-18 LAB — CBC
HCT: 25.4 % — ABNORMAL LOW (ref 39.0–52.0)
Hemoglobin: 7.1 g/dL — ABNORMAL LOW (ref 13.0–17.0)
MCH: 20.5 pg — ABNORMAL LOW (ref 26.0–34.0)
MCHC: 28 g/dL — ABNORMAL LOW (ref 30.0–36.0)
MCV: 73.2 fL — ABNORMAL LOW (ref 80.0–100.0)
Platelets: 57 10*3/uL — ABNORMAL LOW (ref 150–400)
RBC: 3.47 MIL/uL — ABNORMAL LOW (ref 4.22–5.81)
RDW: 21.5 % — ABNORMAL HIGH (ref 11.5–15.5)
WBC: 44.9 10*3/uL — ABNORMAL HIGH (ref 4.0–10.5)
nRBC: 5 % — ABNORMAL HIGH (ref 0.0–0.2)

## 2018-12-18 LAB — LACTIC ACID, PLASMA: Lactic Acid, Venous: 9.1 mmol/L (ref 0.5–1.9)

## 2018-12-18 LAB — HEPARIN LEVEL (UNFRACTIONATED): Heparin Unfractionated: 0.5 IU/mL (ref 0.30–0.70)

## 2018-12-18 LAB — HEPARIN INDUCED PLATELET AB (HIT ANTIBODY): Heparin Induced Plt Ab: 0.058 OD (ref 0.000–0.400)

## 2018-12-18 LAB — CALCIUM, IONIZED: Calcium, Ionized, Serum: 3.7 mg/dL — ABNORMAL LOW (ref 4.5–5.6)

## 2018-12-18 MED ORDER — SODIUM BICARBONATE 8.4 % IV SOLN
100.0000 meq | Freq: Once | INTRAVENOUS | Status: AC
  Administered 2018-12-18: 100 meq via INTRAVENOUS

## 2018-12-18 MED ORDER — SODIUM BICARBONATE 8.4 % IV SOLN
INTRAVENOUS | Status: AC
  Administered 2018-12-18: 05:00:00 100 meq via INTRAVENOUS
  Filled 2018-12-18: qty 50

## 2018-12-18 MED ORDER — SODIUM BICARBONATE 8.4 % IV SOLN
INTRAVENOUS | Status: AC
  Administered 2018-12-18: 03:00:00 50 meq via INTRAVENOUS
  Filled 2018-12-18: qty 100

## 2018-12-18 MED ORDER — SODIUM BICARBONATE 8.4 % IV SOLN
INTRAVENOUS | Status: DC
  Administered 2018-12-18: 03:00:00 via INTRAVENOUS
  Filled 2018-12-18 (×2): qty 150

## 2018-12-18 MED ORDER — SODIUM BICARBONATE 8.4 % IV SOLN
50.0000 meq | Freq: Once | INTRAVENOUS | Status: AC
  Administered 2018-12-18: 50 meq via INTRAVENOUS

## 2018-12-18 MED ORDER — CALCIUM GLUCONATE-NACL 2-0.675 GM/100ML-% IV SOLN
2.0000 g | Freq: Once | INTRAVENOUS | Status: AC
  Administered 2018-12-18: 2000 mg via INTRAVENOUS
  Filled 2018-12-18: qty 100

## 2018-12-19 NOTE — Telephone Encounter (Signed)
Received original signed D/C-funeral home notified for pick up.  

## 2018-12-21 LAB — CULTURE, BLOOD (ROUTINE X 2)
Culture: NO GROWTH
Culture: NO GROWTH

## 2018-12-22 MED FILL — Medication: Qty: 1 | Status: AC

## 2018-12-31 NOTE — Telephone Encounter (Signed)
Received a call from [facility] Dept.) stating that Freida Busman and [employer] [facility] will bring another D/C to be signed due to the state rejecting the first. Part II of the death certificate had a draw threw which made it unacceptable.

## 2019-01-06 ENCOUNTER — Telehealth: Payer: Self-pay

## 2019-01-06 NOTE — Telephone Encounter (Signed)
Received dc from [employer].  There was something wrong with the first dc.. DC will be taken to [facility] for signature.  On 01/07/2019 Received dc back from Doctor Agarwala. I called funeral home to let them know dc was ready for pickup.

## 2019-01-12 NOTE — Progress Notes (Signed)
NAME:  Daniel Price, MRN:  [id number], DOB:  12-22-1950, LOS: 6 ADMISSION DATE:  01/02/2019, CONSULTATION DATE:  12/14/2018 REFERRING MD:  Dr. Gala Romney, Cardiology, CHIEF COMPLAINT:  Short of breath   Brief History   68 yr old male smoker presented with 3 days of dyspnea, wheeze, and leg swelling.  Treated for CHF exacerbation.  Required intubation 5/3  for respiratory support.  Past Medical History  COPD, MV replacement, CHF, CVA, HTN, CAD, Substance abuse  Significant Hospital Events   5/01 Admit 5/03 Intubated  Consults:  Cardiology  Procedures:  Lt IJ CVL 5/02 >> ETT 5/03 >>  Significant Diagnostic Tests:  Echo 5/02 >> EF 45 to 50%, septal flattening, severe RV systolic dysfx, mod pericardial effusion  Micro Data:  COVID 5/01 >> negative BCx 5/03 >> Salmonella (pan sensitive ampicillin/levo/bactrim)  Trach asp 5/4>> BCx 2 5/3 >>Salmonella  C Diff neg 5/5    Antimicrobials:  Ceftriaxone 5/4>5/5 Vanc 5/4>5/5 Zosyn 5/4>   Interim history/subjective:  Remains on multiple pressors /inotrope support  Final BC positive for Salmonella  CVP   +16.5 L I/O Bal since admit, wt up  since admit  Fevers persist , wbc tr up (44K) , T Max 99 Bicarb gtt initiated 5/7 with continued acidosis  Levo at 100, Epi at 25, Dopamine at 5, Vaso at 0.4 Worsening scr and no UO CXR with right pleural effusion, atelectasis,and trace L effusion  Co Ox 79.7  Objective   Blood pressure (!) 75/65, pulse 74, temperature (!) 96.4 F (35.8 C), temperature source Esophageal, resp. rate (!) 27, height  (1.88 m), weight 93 kg, SpO2 (!) 54 %. CVP:  [19 mmHg] 19 mmHg  Vent Mode: PRVC FiO2 (%):  [40 %-70 %] 70 % Set Rate:  [22 bmp] 22 bmp Vt Set:  [650 mL] 650 mL PEEP:  [8 cmH20-10 cmH20] 8 cmH20 Plateau Pressure:  [13 cmH20-25 cmH20] 25 cmH20   Intake/Output Summary (Last 24 hours) at 01-08-2019 0919 Last data filed at 01-08-2019 0700 Gross per 24 hour  Intake 5453.5 ml  Output --  Net  5453.5 ml   Filed Weights   12/14/18 0349 12/16/18 0500 12/17/18 0500  Weight: 83.4 kg 87.5 kg 93 kg   Physical Exam: General: Chronically ill-appearing male, sedated,  HENT: [place], AT, ETT secure and intact, OG tube secure, no LAD  Respiratory: Bilateral chest excursion, coarse breath sounds throughout , diminished breath sounds in the bases  Cardiovascular: Tachycardia, no MRG  GI: Bowel sounds positive, soft, flexiseal, tube feeds Extremities: 3+ pitting edema, lower extremities wrapped  Neuro: Sedated, follows intermittent commands per RN GU: Condom cath in place no  urine output   Chest x-ray 5/7 -no significant change, moderate right pleural effusion with atelectasis, left atelectasis/infiltrate, interstitial edema  Resolved Hospital Problem list     Assessment & Plan:   Acute hypoxic respiratory failure. COPD. COVID-19 negative Self extubated 5/4 requiring re-intubation  Plan - Full vent support - ABG  prn - Wean PEEP as able - Hold diuresis for now as pressor dependent - BD prn  - CXR in am and prn  Septic Shock secondary Salmonella Bacteremia. Plan -Continue Zosyn - IVF  -Wean pressor support for MAP goal >65 -  Repeat BCx for clearance when indicated - Trend lactate - Stress dose steroids 5/5  Acute on chronic diastolic CHF with RV failure. Cardiogenic shock. Mitral stenosis s/p bioprosthetic MV. A fib with RVR. Now on levo, Vaso, Dopamine ,HCO3 gtt Plan -  Amiodarone for AFRVR - Inotropic and pressor support  - Continue  vasopressin - F/u CVP, cooximetry  Renal>> AKI -Worsening serum creatinine with no urine output  Metabolic acidosis P Monitor BMET and UO Avoid nephrotoxic medications Maintain renal perfusion Bladder scan prn No response to diuresis HCO3 gtt and pushes  Nutrition  P Hold TF   Hyperkalemia Plan - Daily BMET - Replete K prn  Anemia of critical illness, iron deficiency and chronic disease. Heparin gtt Plan - f/u  CBC - transfuse for Hb < 7 - Monitor for obvious signs of bleeding  Patient has decompensated further over night despite initiation of max pressors and Bicarb gtt and Bicarb pushes for metabolic acidosis. Dr. Gala RomneyBensimhon has called the family in. Suspect he will survive only a few more hours despite continued aggressive care..  Partial Code, Intubation only but already tubed.  Best practice:  Diet: NPO/tube feeds DVT prophylaxis: heparin gtt GI prophylaxis: protonix Mobility: bed rest Code Status: intubation only>> already tubed Disposition: ICU  Labs   CBC: Recent Labs  Lab 2018-12-15 1630  12/14/18 0341  12/15/18 0500  12/16/18 0353  12/17/18 0408 12/17/18 0900 12/17/18 0905 12/25/2018 0202 12/23/2018 0400 01/07/2019 0412 12/23/2018 0742  WBC 26.5*   < > 28.9*  --  7.9  --  22.8*  --  37.8*  --   --   --  44.9*  --   --   NEUTROABS 24.5*  --   --   --   --   --   --   --   --   --   --   --   --   --   --   HGB 10.4*   < > 9.3*   < > 9.1*   < > 8.6*   < > 8.3*  --  9.5* 8.8* 7.1* 8.5* 7.8*  HCT 38.3*   < > 31.8*   < > 30.7*   < > 29.6*   < > 27.5*  --  28.0* 26.0* 25.4* 25.0* 23.0*  MCV 76.3*   < > 70.2*  --  69.6*  --  70.1*  --  67.9*  --   --   --  73.2*  --   --   PLT 219   < > 197  --  182  --  PLATELET CLUMPS NOTED ON SMEAR, COUNT APPEARS DECREASED  --  102* 84*  --   --  57*  --   --    < > = values in this interval not displayed.    Basic Metabolic Panel: Recent Labs  Lab 12/14/18 2035  12/15/18 0500  12/15/18 1447  12/16/18 0353  12/17/18 0408 12/17/18 0905 01/11/2019 0202 12/22/2018 0400 12/29/2018 0412 01/03/2019 0742  NA 136   < > 136   < > 137   < > 135   < > 134* 133* 130* 131* 130* 130*  K 2.6*   < > 3.3*   < > 3.0*   < > 3.6   < > 4.0 4.0 5.1 5.1 5.1 5.4*  CL 104  --  107  --  107  --  107  --  102  --   --  101  --   --   CO2 19*  --  18*  --  14*  --  13*  --  14*  --   --  7*  --   --   GLUCOSE 165*  --  117*  --  133*  --  99  --  97  --   --  75  --   --     BUN 46*  --  50*  --  52*  --  53*  --  57*  --   --  61*  --   --   CREATININE 1.58*  --  1.64*  --  2.28*  --  2.58*  --  3.28*  --   --  3.84*  --   --   CALCIUM 8.2*  --  7.9*  --  7.2*  --  7.0*  --  7.1*  --   --  7.3*  --   --   MG 2.1  --  1.8  --  2.3  --  2.1  --  2.1  --   --   --   --   --   PHOS 4.8*  --  3.8  --  3.9  --  4.6  --  5.4*  --   --   --   --   --    < > = values in this interval not displayed.   GFR: Estimated Creatinine Clearance: 22 mL/min (A) (by C-G formula based on SCr of 3.84 mg/dL (H)). Recent Labs  Lab 12/14/18 0544 12/14/18 0820 12/15/18 0500 12/16/18 0353 12/16/18 1645 12/17/18 0408 2019-01-03 0400  WBC  --   --  7.9 22.8*  --  37.8* 44.9*  LATICACIDVEN 2.1* 2.0*  --   --  2.8*  --  9.1*    Liver Function Tests: Recent Labs  Lab 12/31/2018 1630 12/17/18 0408  AST 53* 66*  ALT <5 37  ALKPHOS 84 126  BILITOT 2.2* 1.8*  PROT 6.4* 4.9*  ALBUMIN 2.9* 1.5*   No results for input(s): LIPASE, AMYLASE in the last 168 hours. No results for input(s): AMMONIA in the last 168 hours.  ABG    Component Value Date/Time   PHART 7.089 (LL) 01-03-2019 0742   PCO2ART 25.0 (L) 01/03/19 0742   PO2ART 78.0 (L) 01/03/19 0742   HCO3 7.7 (L) 03-Jan-2019 0742   TCO2 8 (L) 2019-01-03 0742   ACIDBASEDEF 21.0 (H) 2019-01-03 0742   O2SAT 91.0 01-03-2019 0742     Coagulation Profile: Recent Labs  Lab 12/17/18 0900  INR 1.5*    Cardiac Enzymes: Recent Labs  Lab 12/16/2018 1630 12/16/2018 2235 12/13/18 0444 12/13/18 1023  TROPONINI 0.09* 0.09* 0.09* 0.08*    HbA1C: No results found for: HGBA1C  CBG: Recent Labs  Lab 12/17/18 1539 01/03/19 0150 01-03-19 0151 2019/01/03 0409 01/03/19 0831  GLUCAP 140* 68* 78 74 93    Critical care time: 30  min     Bevelyn Ngo, AGACNP-BC [facility] Pulmonary/Critical Care Medicine Pager # [phone] After 4 pm please call  [phone]  2019-01-03   9:19 AM

## 2019-01-12 NOTE — Progress Notes (Signed)
eLink Physician-Brief Progress Note Patient Name: Daniel Price DOB: February 12, 1951 MRN: [id number]   Date of Service  12/26/2018  HPI/Events of Note  Pt remains acidotic despite 1 amp of bicarb iv and a bicarb infusion  eICU Interventions  Sodium bicarbonate 2 amps iv bolus x 1 now        [employer] 12/27/2018, 4:45 AM

## 2019-01-12 NOTE — Progress Notes (Signed)
Progress Note  Patient Name: Daniel Price Date of Encounter: Dec 21, 2018  Primary Cardiologist: [place] to deteriorate overnight. Now unresponsive on epi 20, NE 70, vasopressin 0.03 and dopa 5.   SBP 50s   Echo performed. LVEF ok. MV thickened and appears stenotic. Mean gradient ~20. RV markedly dialted and severely HK.   Inpatient Medications    Scheduled Meds:  atorvastatin  40 mg Per Tube q1800   chlorhexidine gluconate (MEDLINE KIT)  15 mL Mouth Rinse BID   Chlorhexidine Gluconate Cloth  6 each Topical Daily   fentaNYL (SUBLIMAZE) injection  50 mcg Intravenous Once   hydrocortisone sod succinate (SOLU-CORTEF) inj  100 mg Intravenous Q6H   mouth rinse  15 mL Mouth Rinse 10 times per day   pantoprazole sodium  40 mg Per Tube Q24H   sodium chloride flush  10-40 mL Intracatheter Q12H   Continuous Infusions:  amiodarone 30 mg/hr (12-21-18 1100)   DOPamine 5 mcg/kg/min (2018/12/21 1100)   epinephrine 25 mcg/min (2018-12-21 1100)   feeding supplement (VITAL 1.5 CAL) 1,000 mL (12/16/18 1348)   fentaNYL infusion INTRAVENOUS 100 mcg/hr (Dec 21, 2018 1100)   heparin Stopped (2018-12-21 0750)   norepinephrine (LEVOPHED) Adult infusion 100 mcg/min (Dec 21, 2018 1100)   piperacillin-tazobactam (ZOSYN)  IV Stopped (Dec 21, 2018 1050)    sodium bicarbonate  infusion 1000 mL 75 mL/hr at 12-21-2018 1100   vasopressin (PITRESSIN) infusion - *FOR SHOCK* 0.04 Units/min (Dec 21, 2018 1100)   PRN Meds: acetaminophen **OR** acetaminophen, bisacodyl, docusate, fentaNYL, ipratropium-albuterol, midazolam, sodium chloride flush   Vital Signs    Vitals:   12/21/2018 1030 Dec 21, 2018 1045 12/21/18 1055 12/21/18 1100  BP: (!) 76/54 (!) 73/52  (!) 73/55  Pulse:      Resp: (!) 22 (!) 25  (!) 23  Temp: (!) 96.6 F (35.9 C) (!) 96.6 F (35.9 C) (!) 96.6 F (35.9 C) (!) 96.6 F (35.9 C)  TempSrc:   Core   SpO2:      Weight:      Height:        Intake/Output Summary  (Last 24 hours) at Dec 21, 2018 1147 Last data filed at Dec 21, 2018 1100 Gross per 24 hour  Intake 6128.63 ml  Output --  Net 6128.63 ml   Last 3 Weights 12/17/2018 12/16/2018 12/14/2018  Weight (lbs) 205 lb 0.4 oz 192 lb 14.4 oz 183 lb 13.8 oz  Weight (kg) 93 kg 87.5 kg 83.4 kg      Telemetry    Sinus 70-80s Personally reviewed   Physical Exam   General:  Terminally-ill appearing on vent HEENT: normal + ETT Neck: supple. JVP to jaw Carotids 2+ bilat; no bruits. No lymphadenopathy or thryomegaly appreciated. Cor: PMI laterally displaced. Regular rate & rhythm. 2/6 TR/MR Lungs: course Abdomen: soft, nontender, + distended. Extremities: mottled rash, 4+ edema weeping Neuro: unresponsive      Labs    Chemistry Recent Labs  Lab 01/02/2019 1630  12/16/18 0353  12/17/18 0408  Dec 21, 2018 0400 Dec 21, 2018 0412 Dec 21, 2018 0742  NA 141   < > 135   < > 134*   < > 131* 130* 130*  K 4.6   < > 3.6   < > 4.0   < > 5.1 5.1 5.4*  CL 108   < > 107  --  102  --  101  --   --   CO2 14*   < > 13*  --  14*  --  7*  --   --  GLUCOSE 99   < > 99  --  97  --  75  --   --   BUN 36*   < > 53*  --  57*  --  61*  --   --   CREATININE 1.58*   < > 2.58*  --  3.28*  --  3.84*  --   --   CALCIUM 9.0   < > 7.0*  --  7.1*  --  7.3*  --   --   PROT 6.4*  --   --   --  4.9*  --   --   --   --   ALBUMIN 2.9*  --   --   --  1.5*  --   --   --   --   AST 53*  --   --   --  66*  --   --   --   --   ALT <5  --   --   --  37  --   --   --   --   ALKPHOS 84  --   --   --  126  --   --   --   --   BILITOT 2.2*  --   --   --  1.8*  --   --   --   --   GFRNONAA 45*   < > 25*  --  19*  --  15*  --   --   GFRAA 52*   < > 29*  --  22*  --  18*  --   --   ANIONGAP 19*   < > 15  --  18*  --  23*  --   --    < > = values in this interval not displayed.     Hematology Recent Labs  Lab 12/16/18 0353  12/17/18 0408 12/17/18 0900  Jan 11, 2019 0400 01-11-2019 0412 01-11-19 0742  WBC 22.8*  --  37.8*  --   --  44.9*  --   --    RBC 4.22  --  4.05*  --   --  3.47*  --   --   HGB 8.6*   < > 8.3*  --    < > 7.1* 8.5* 7.8*  HCT 29.6*   < > 27.5*  --    < > 25.4* 25.0* 23.0*  MCV 70.1*  --  67.9*  --   --  73.2*  --   --   MCH 20.4*  --  20.5*  --   --  20.5*  --   --   MCHC 29.1*  --  30.2  --   --  28.0*  --   --   RDW 20.6*  --  21.2*  --   --  21.5*  --   --   PLT PLATELET CLUMPS NOTED ON SMEAR, COUNT APPEARS DECREASED  --  102* 84*  --  57*  --   --    < > = values in this interval not displayed.    Cardiac Enzymes Recent Labs  Lab 12/20/2018 1630 12/15/2018 2235 12/13/18 0444 12/13/18 1023  TROPONINI 0.09* 0.09* 0.09* 0.08*   No results for input(s): TROPIPOC in the last 168 hours.   BNP Recent Labs  Lab 12/31/2018 1630  BNP 1,859.8*     DDimer  Recent Labs  Lab 12/17/18 0900  DDIMER 6.59*     Radiology  Dg Chest Port 1 View  Result Date: 01/13/19 CLINICAL DATA:  68 year old male, admission with intubation EXAM: PORTABLE CHEST 1 VIEW COMPARISON:  Multiple prior, most recent 12/17/2018 FINDINGS: Cardiomediastinal silhouette likely unchanged partially obscured by overlying lung and pleural disease. Surgical changes of median sternotomy. Defibrillator pads are unchanged. Endotracheal tube terminates 5.7 cm above the carina. Gastric tube projects over the mediastinum terminates out of the field of view. Unchanged left IJ central venous catheter which appears to terminate superior vena cava. Similar appearance of dense opacity at the right lung base obscuring the right hemidiaphragm and the right heart border with pleuroparenchymal thickening. Blunting of left costophrenic angle. Similar appearance of mixed interstitial airspace disease of the bilateral lungs, worst on the right. No pneumothorax. IMPRESSION: Unchanged endotracheal tube, gastric tube, and left IJ central venous catheter. Right pleural effusion with associated atelectasis/consolidation with trace left pleural effusion. Similar appearance of  mixed interstitial and airspace opacities. Electronically Signed   By: Corrie Mckusick D.O.   On: 01-13-2019 07:52   Dg Chest Port 1 View  Result Date: 12/17/2018 CLINICAL DATA:  68 year old male status post mitral valve replacement with respiratory failure EXAM: PORTABLE CHEST 1 VIEW COMPARISON:  Prior chest x-ray 12/15/2018 FINDINGS: The tip of the endotracheal tube is 5.3 cm above the carina. Left IJ central venous catheter in stable position with the tip overlying the lower SVC. Gastric tube is present. The tip is not identified but lies below the diaphragm, presumably within the stomach. External defibrillator pads project over the chest. Patient is status post median sternotomy. Stable cardiac and mediastinal contours. Persistent moderate right pleural effusion with associated right basilar atelectasis. Mild pulmonary vascular congestion bordering on mild interstitial edema remains unchanged. Left retrocardiac opacity may reflect atelectasis or infiltrate. No pneumothorax. Overall, no significant interval change in the appearance of the chest. IMPRESSION: 1. Overall, no significant interval change in the appearance of the chest compared to 12/15/2018. 2. Persistent moderate layering right pleural effusion with associated right basilar atelectasis. 3. Persistent left retrocardiac airspace opacity which may reflect atelectasis and/or infiltrate. Smaller left effusion not excluded. 4. Pulmonary vascular congestion bordering on mild interstitial edema. 5. Stable and satisfactory support apparatus. Electronically Signed   By: Jacqulynn Cadet M.D.   On: 12/17/2018 08:12    Patient Profile     68 y.o. male with PMH of MV replacement, COPD, HLD, CVA and possible ICM who presented with 2 weeks onset of LE edema. Denies any CP. 3+ pitting edema bilaterally. He became hypotensive after IV diuresis.   Assessment & Plan    1. 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 Mitral stenosis  3. GNR sepsis with septic shock   4. Acute hypoxic respiratory failure  5. Paroxysmal  AF/AFL with RVR  6. AKI  He is terminally ill and remains hypotensive despite maximal pressors. I have discussed with his family and told them that he will not survive the morning. We will change him to Limited Code and they will come visit him this am .    CRITICAL CARE Performed by: Glori Bickers  Total critical care time: 45 minutes  Critical care time was exclusive of separately billable procedures and treating other patients.  Critical care was necessary to treat or prevent imminent or life-threatening deterioration.  Critical care was time spent personally by me (independent of midlevel providers or residents) on the following activities: development of treatment plan with patient and/or surrogate as well  as nursing, discussions with consultants, evaluation of patient's response to treatment, examination of patient, obtaining history from patient or surrogate, ordering and performing treatments and interventions, ordering and review of laboratory studies, ordering and review of radiographic studies, pulse oximetry and re-evaluation of patient's condition.    For questions or updates, please contact [place] Please consult [email or web address] for contact info under       Signed, Glori Bickers MD

## 2019-01-12 NOTE — Progress Notes (Signed)
RN spoke with IP and okay to d/c enteric precautions. Cdiff negative, Salmonella in bloodstream. No contact required for this.  RN will continue to monitor.

## 2019-01-12 NOTE — Death Summary Note (Signed)
DEATH SUMMARY   Patient Details  Name: Daniel Price MRN: [id number] DOB: 1951/02/11  Admission/Discharge Information   Admit Date:  2018-12-17  Date of Death: Date of Death: 12-23-2018  Time of Death: Time of Death: 1150  Length of Stay: 6  Referring Physician: [facility], [facility]   Reason(s) for Hospitalization  dyspnea.  Diagnoses  Preliminary cause of death:  Secondary Diagnoses (including complications and co-morbidities):  Principal Problem:   Acute exacerbation of CHF (congestive heart failure) (HCC) Active Problems:   Pressure injury of skin   Acute respiratory failure with hypoxia (HCC)   Iron deficiency anemia   Cardiogenic shock (HCC)   Acute kidney injury (HCC)   COPD (chronic obstructive pulmonary disease) (HCC)   PAF (paroxysmal atrial fibrillation) (HCC)   Malnutrition of moderate degree   Brief Hospital Course (including significant findings, care, treatment, and services provided and events leading to death)  Daniel Price is a 68 y.o. year old male who has know advanced HF. He presented SOB and hypotensive. He was intubated. He grew Salmonella in blood. He failed to improve with antibiotics and sepsis resuscitation. After a discussion with family and HF Service, active therapy was stopped and he was compassionately extubated.  Pertinent Labs and Studies  Significant Diagnostic Studies Dg Chest Port 1 View  Result Date: 23-Dec-2018 CLINICAL DATA:  68 year old male, admission with intubation EXAM: PORTABLE CHEST 1 VIEW COMPARISON:  Multiple prior, most recent 12/17/2018 FINDINGS: Cardiomediastinal silhouette likely unchanged partially obscured by overlying lung and pleural disease. Surgical changes of median sternotomy. Defibrillator pads are unchanged. Endotracheal tube terminates 5.7 cm above the carina. Gastric tube projects over the mediastinum terminates out of the field of view. Unchanged left IJ central venous catheter which appears to terminate  superior vena cava. Similar appearance of dense opacity at the right lung base obscuring the right hemidiaphragm and the right heart border with pleuroparenchymal thickening. Blunting of left costophrenic angle. Similar appearance of mixed interstitial airspace disease of the bilateral lungs, worst on the right. No pneumothorax. IMPRESSION: Unchanged endotracheal tube, gastric tube, and left IJ central venous catheter. Right pleural effusion with associated atelectasis/consolidation with trace left pleural effusion. Similar appearance of mixed interstitial and airspace opacities. Electronically Signed   By: Gilmer Mor D.O.   On: 12-23-2018 07:52   Dg Chest Port 1 View  Result Date: 12/17/2018 CLINICAL DATA:  68 year old male status post mitral valve replacement with respiratory failure EXAM: PORTABLE CHEST 1 VIEW COMPARISON:  Prior chest x-ray 12/15/2018 FINDINGS: The tip of the endotracheal tube is 5.3 cm above the carina. Left IJ central venous catheter in stable position with the tip overlying the lower SVC. Gastric tube is present. The tip is not identified but lies below the diaphragm, presumably within the stomach. External defibrillator pads project over the chest. Patient is status post median sternotomy. Stable cardiac and mediastinal contours. Persistent moderate right pleural effusion with associated right basilar atelectasis. Mild pulmonary vascular congestion bordering on mild interstitial edema remains unchanged. Left retrocardiac opacity may reflect atelectasis or infiltrate. No pneumothorax. Overall, no significant interval change in the appearance of the chest. IMPRESSION: 1. Overall, no significant interval change in the appearance of the chest compared to 12/15/2018. 2. Persistent moderate layering right pleural effusion with associated right basilar atelectasis. 3. Persistent left retrocardiac airspace opacity which may reflect atelectasis and/or infiltrate. Smaller left effusion not  excluded. 4. Pulmonary vascular congestion bordering on mild interstitial edema. 5. Stable and satisfactory support apparatus. Electronically Signed  By: Malachy Moan M.D.   On: 12/17/2018 08:12   Dg Chest Port 1 View  Result Date: 12/15/2018 CLINICAL DATA:  Post intubation. EXAM: PORTABLE CHEST 1 VIEW COMPARISON:  12/14/2018 FINDINGS: Endotracheal tube has tip 8.8 cm above the carina. Nasogastric tube courses into the region of the stomach and off the film as tip is not visualized. Left IJ central venous catheter has tip over the SVC. Persistent opacification over the right mid to lower lung likely moderate size effusion with associated atelectasis. Improving left base opacification. Improved aeration over the right perihilar region and upper lung. Cardiomediastinal silhouette and remainder of the exam is unchanged. IMPRESSION: Improved aeration over the right mid to upper lung and left lung base possibly improving infection versus asymmetric edema. Persistent opacification over the right mid to lower lung likely moderate size effusion with associated atelectasis. Tubes and lines as described. Electronically Signed   By: Elberta Fortis M.D.   On: 12/15/2018 10:12   Dg Chest Port 1 View  Result Date: 12/14/2018 CLINICAL DATA:  ET tube EXAM: PORTABLE CHEST 1 VIEW COMPARISON:  12/13/2018 FINDINGS: Moderate to large right pleural effusion and small left pleural effusion. Bilateral airspace disease, right greater than left. There is cardiomegaly. Left central line remains in place, unchanged. Interval placement endotracheal tube with the tip 8 cm above the carina. NG tube enters the stomach. IMPRESSION: Endotracheal tube 8 cm above the carina. Bilateral effusions and airspace disease, right greater than left, similar to prior study. Electronically Signed   By: Charlett Nose M.D.   On: 12/14/2018 19:05   Dg Chest Port 1 View  Result Date: 12/13/2018 CLINICAL DATA:  Central line placement EXAM: PORTABLE CHEST  1 VIEW COMPARISON:  12/13/2018 FINDINGS: Left internal jugular central line is been placed with the tip in the SVC. No pneumothorax. Cardiomegaly. Bilateral perihilar and lower lobe airspace opacities with moderate effusions, similar to prior study. IMPRESSION: Left central line placement with the tip in the SVC. No pneumothorax. Continued bilateral airspace disease and effusions, unchanged. Electronically Signed   By: Charlett Nose M.D.   On: 12/13/2018 20:13   Dg Chest Port 1 View  Result Date: 12/13/2018 CLINICAL DATA:  Shortness of breath, labored breathing EXAM: PORTABLE CHEST 1 VIEW COMPARISON:  Jan 08, 2019 FINDINGS: Small right pleural effusion. Bilateral interstitial and patchy alveolar airspace opacities. No pneumothorax. Stable cardiomegaly. Prior median sternotomy. No acute osseous abnormality. IMPRESSION: Mild pulmonary edema. Electronically Signed   By: Elige Ko   On: 12/13/2018 14:39   Dg Chest Port 1 View  Result Date: Jan 08, 2019 CLINICAL DATA:  Shortness of breath. EXAM: PORTABLE CHEST 1 VIEW COMPARISON:  01/14/2005 FINDINGS: There is a moderate right effusion and a small left effusion. There is bilateral interstitial pulmonary edema. Cardiomegaly. Pulmonary vascularity is slightly prominent. No acute bone abnormality. Median sternotomy. IMPRESSION: 1. Bilateral pulmonary edema with bilateral pleural effusions, right greater than left. 2. Cardiomegaly with mild pulmonary vascular congestion. Findings are consistent with congestive heart failure. Electronically Signed   By: Francene Boyers M.D.   On: 2019-01-08 16:47   [place] Ekg Site Rite  Result Date: 12/13/2018 If Site Rite image not attached, placement could not be confirmed due to current cardiac rhythm.  [place] Ekg Site Rite  Result Date: 12/13/2018 If Site Rite image not attached, placement could not be confirmed due to current cardiac rhythm.   Microbiology Recent Results (from the past 240 hour(s))  SARS Coronavirus 2 [facility]  order, Performed in [facility] hospital lab)  Status: None   Collection Time: 12/25/2018  4:30 PM  Result Value Ref Range Status   SARS Coronavirus 2 NEGATIVE NEGATIVE Final    Comment: (NOTE) If result is NEGATIVE SARS-CoV-2 target nucleic acids are NOT DETECTED. The SARS-CoV-2 RNA is generally detectable in upper and lower  respiratory specimens during the acute phase of infection. The lowest  concentration of SARS-CoV-2 viral copies this assay can detect is 250  copies / mL. A negative result does not preclude SARS-CoV-2 infection  and should not be used as the sole basis for treatment or other  patient management decisions.  A negative result may occur with  improper specimen collection / handling, submission of specimen other  than nasopharyngeal swab, presence of viral mutation(s) within the  areas targeted by this assay, and inadequate number of viral copies  (<250 copies / mL). A negative result must be combined with clinical  observations, patient history, and epidemiological information. If result is POSITIVE SARS-CoV-2 target nucleic acids are DETECTED. The SARS-CoV-2 RNA is generally detectable in upper and lower  respiratory specimens dur ing the acute phase of infection.  Positive  results are indicative of active infection with SARS-CoV-2.  Clinical  correlation with patient history and other diagnostic information is  necessary to determine patient infection status.  Positive results do  not rule out bacterial infection or co-infection with other viruses. If result is PRESUMPTIVE POSTIVE SARS-CoV-2 nucleic acids MAY BE PRESENT.   A presumptive positive result was obtained on the submitted specimen  and confirmed on repeat testing.  While 2019 novel coronavirus  (SARS-CoV-2) nucleic acids may be present in the submitted sample  additional confirmatory testing may be necessary for epidemiological  and / or clinical management purposes  to differentiate between   SARS-CoV-2 and other Sarbecovirus currently known to infect humans.  If clinically indicated additional testing with an alternate test  methodology [phone] is advised. The SARS-CoV-2 RNA is generally  detectable in upper and lower respiratory sp ecimens during the acute  phase of infection. The expected result is Negative. Fact Sheet for Patients:  BoilerBrush.com.cy Fact Sheet for Healthcare Providers: https://pope.com/ This test is not yet approved or cleared by the [place] FDA and has been authorized for detection and/or diagnosis of SARS-CoV-2 by FDA under an Emergency Use Authorization (EUA).  This EUA will remain in effect (meaning this test can be used) for the duration of the COVID-19 declaration under Section 564(b)(1) of the Act, 21 U.S.C. section 360bbb-3(b)(1), unless the authorization is terminated or revoked sooner. Performed at [facility], [address], [place], [place] [address]   Culture, blood (routine x 2)     Status: Abnormal   Collection Time: 12/14/18 11:45 AM  Result Value Ref Range Status   Specimen Description BLOOD RIGHT HAND  Final   Special Requests   Final    BOTTLES DRAWN AEROBIC AND ANAEROBIC Blood Culture adequate volume   Culture  Setup Time   Final    GRAM NEGATIVE RODS IN BOTH AEROBIC AND ANAEROBIC BOTTLES CRITICAL RESULT CALLED TO, READ BACK BY AND VERIFIED WITH: PHRMD J ORIET @[id number]  12/15/18 BY S GEZAHEGN    Culture (A)  Final    SALMONELLA SPECIES HEALTH DEPARTMENT NOTIFIED Performed at [facility], [address], [place], [place] [address]    Report Status 12/16/2018 FINAL  Final   Organism ID, Bacteria SALMONELLA SPECIES  Final      Susceptibility   Salmonella species - MIC*  AMPICILLIN <=2 SENSITIVE Sensitive     LEVOFLOXACIN <=0.12 SENSITIVE Sensitive     TRIMETH/SULFA <=20 SENSITIVE Sensitive     * SALMONELLA SPECIES  Blood Culture ID Panel (Reflexed)      Status: Abnormal   Collection Time: 12/14/18 11:45 AM  Result Value Ref Range Status   Enterococcus species NOT DETECTED NOT DETECTED Final   Listeria monocytogenes NOT DETECTED NOT DETECTED Final   Staphylococcus species NOT DETECTED NOT DETECTED Final   Staphylococcus aureus (BCID) NOT DETECTED NOT DETECTED Final   Streptococcus species NOT DETECTED NOT DETECTED Final   Streptococcus agalactiae NOT DETECTED NOT DETECTED Final   Streptococcus pneumoniae NOT DETECTED NOT DETECTED Final   Streptococcus pyogenes NOT DETECTED NOT DETECTED Final   Acinetobacter baumannii NOT DETECTED NOT DETECTED Final   Enterobacteriaceae species DETECTED (A) NOT DETECTED Final    Comment: Enterobacteriaceae represent a large family of gram negative bacteria, not a single organism. Refer to culture for further identification. CRITICAL RESULT CALLED TO, READ BACK BY AND VERIFIED WITH: PHRMD J ORIET  12/15/18 BY S GEZAHEGN    Enterobacter cloacae complex NOT DETECTED NOT DETECTED Final   Escherichia coli NOT DETECTED NOT DETECTED Final   Klebsiella oxytoca NOT DETECTED NOT DETECTED Final   Klebsiella pneumoniae NOT DETECTED NOT DETECTED Final   Proteus species NOT DETECTED NOT DETECTED Final   Serratia marcescens NOT DETECTED NOT DETECTED Final   Carbapenem resistance NOT DETECTED NOT DETECTED Final   Haemophilus influenzae NOT DETECTED NOT DETECTED Final   Neisseria meningitidis NOT DETECTED NOT DETECTED Final   Pseudomonas aeruginosa NOT DETECTED NOT DETECTED Final   Candida albicans NOT DETECTED NOT DETECTED Final   Candida glabrata NOT DETECTED NOT DETECTED Final   Candida krusei NOT DETECTED NOT DETECTED Final   Candida parapsilosis NOT DETECTED NOT DETECTED Final   Candida tropicalis NOT DETECTED NOT DETECTED Final    Comment: Performed at [facility], [address], [place], [place] [address]  Culture, blood (routine x 2)     Status: Abnormal   Collection Time: 12/14/18 11:57 AM   Result Value Ref Range Status   Specimen Description BLOOD RIGHT ARM  Final   Special Requests   Final    BOTTLES DRAWN AEROBIC ONLY Blood Culture adequate volume   Culture  Setup Time   Final    GRAM NEGATIVE RODS AEROBIC BOTTLE ONLY CRITICAL VALUE NOTED.  VALUE IS CONSISTENT WITH PREVIOUSLY REPORTED AND CALLED VALUE.    Culture (A)  Final    SALMONELLA SPECIES SUSCEPTIBILITIES PERFORMED ON PREVIOUS CULTURE WITHIN THE LAST 5 DAYS. HEALTH DEPARTMENT NOTIFIED Performed at [facility], [address], [place], [place] [address]    Report Status 12/17/2018 FINAL  Final  MRSA PCR Screening     Status: None   Collection Time: 12/14/18  4:45 PM  Result Value Ref Range Status   MRSA by PCR NEGATIVE NEGATIVE Final    Comment:        The GeneXpert MRSA Assay (FDA approved for NASAL specimens only), is one component of a comprehensive MRSA colonization surveillance program. It is not intended to diagnose MRSA infection nor to guide or monitor treatment for MRSA infections. Performed at [facility], [address] [place], [place] [address]   Culture, blood (routine x 2)     Status: None (Preliminary result)   Collection Time: 12/15/18  9:45 AM  Result Value Ref Range Status   Specimen Description BLOOD LEFT ARM  Final   Special Requests   Final    BOTTLES DRAWN AEROBIC ONLY Blood Culture results may not be optimal due to an inadequate volume of blood received in culture bottles   Culture   Final    NO GROWTH 4 DAYS Performed at [facility], [address] [place], [place] [address]    Report Status PENDING  Incomplete  Culture, blood (routine x 2)     Status: None (Preliminary result)   Collection Time: 12/15/18  9:53 AM  Result Value Ref Range Status   Specimen Description BLOOD RIGHT HAND  Final   Special Requests   Final    BOTTLES DRAWN AEROBIC ONLY Blood Culture results may not be optimal due to an inadequate volume of blood received in culture  bottles   Culture   Final    NO GROWTH 4 DAYS Performed at [facility], [address], [place], [place] [address]    Report Status PENDING  Incomplete  Culture, respiratory (non-expectorated)     Status: None (Preliminary result)   Collection Time: 12/15/18 11:40 AM  Result Value Ref Range Status   Specimen Description TRACHEAL ASPIRATE  Final   Special Requests Immunocompromised  Final   Gram Stain   Final    RARE WBC PRESENT,BOTH PMN AND MONONUCLEAR NO ORGANISMS SEEN    Culture   Final    FEW SALMONELLA SPECIES HEALTH DEPARTMENT NOTIFIED SENT TO [facility] FOR CONFIRMATION Performed at [facility], [address], [place], [place] [address]    Report Status PENDING  Incomplete   Organism ID, Bacteria SALMONELLA SPECIES  Final      Susceptibility   Salmonella species - MIC*    AMPICILLIN <=2 SENSITIVE Sensitive     LEVOFLOXACIN <=0.12 SENSITIVE Sensitive     TRIMETH/SULFA <=20 SENSITIVE Sensitive     * FEW SALMONELLA SPECIES  C difficile quick scan w PCR reflex     Status: None   Collection Time: 12/16/18  6:47 AM  Result Value Ref Range Status   C Diff antigen NEGATIVE NEGATIVE Final   C Diff toxin NEGATIVE NEGATIVE Final   C Diff interpretation No C. difficile detected.  Final    Comment: Performed at [facility], [address] [place], [place] [address]    Lab Basic Metabolic Panel: Recent Labs  Lab 12/14/18 2035  12/15/18 0500  12/15/18 1447  12/16/18 0353  12/17/18 0408 12/17/18 0905 12/29/2018 0202 12/21/2018 0400 01/03/2019 0412 12/23/2018 0742  NA 136   < > 136   < > 137   < > 135   < > 134* 133* 130* 131* 130* 130*  K 2.6*   < > 3.3*   < > 3.0*   < > 3.6   < > 4.0 4.0 5.1 5.1 5.1 5.4*  CL 104  --  107  --  107  --  107  --  102  --   --  101  --   --   CO2 19*  --  18*  --  14*  --  13*  --  14*  --   --  7*  --   --   GLUCOSE 165*  --  117*  --  133*  --  99  --  97  --   --  75  --   --   BUN 46*  --  50*  --  52*  --  53*  --  57*   --   --  61*  --   --   CREATININE 1.58*  --  1.64*  --  2.28*  --  2.58*  --  3.28*  --   --  3.84*  --   --   CALCIUM 8.2*  --  7.9*  --  7.2*  --  7.0*  --  7.1*  --   --  7.3*  --   --   MG 2.1  --  1.8  --  2.3  --  2.1  --  2.1  --   --   --   --   --   PHOS 4.8*  --  3.8  --  3.9  --  4.6  --  5.4*  --   --   --   --   --    < > = values in this interval not displayed.   Liver Function Tests: Recent Labs  Lab 2018-08-28 1630 12/17/18 0408  AST 53* 66*  ALT <5 37  ALKPHOS 84 126  BILITOT 2.2* 1.8*  PROT 6.4* 4.9*  ALBUMIN 2.9* 1.5*   No results for input(s): LIPASE, AMYLASE in the last 168 hours. No results for input(s): AMMONIA in the last 168 hours. CBC: Recent Labs  Lab 2018-08-28 1630  12/14/18 0341  12/15/18 0500  12/16/18 0353  12/17/18 0408 12/17/18 0900 12/17/18 0905 12/16/2018 0202 12/21/2018 0400 12/17/2018 0412 01/03/2019 0742  WBC 26.5*   < > 28.9*  --  7.9  --  22.8*  --  37.8*  --   --   --  44.9*  --   --   NEUTROABS 24.5*  --   --   --   --   --   --   --   --   --   --   --   --   --   --   HGB 10.4*   < > 9.3*   < > 9.1*   < > 8.6*   < > 8.3*  --  9.5* 8.8* 7.1* 8.5* 7.8*  HCT 38.3*   < > 31.8*   < > 30.7*   < > 29.6*   < > 27.5*  --  28.0* 26.0* 25.4* 25.0* 23.0*  MCV 76.3*   < > 70.2*  --  69.6*  --  70.1*  --  67.9*  --   --   --  73.2*  --   --   PLT 219   < > 197  --  182  --  PLATELET CLUMPS NOTED ON SMEAR, COUNT APPEARS DECREASED  --  102* 84*  --   --  57*  --   --    < > = values in this interval not displayed.   Cardiac Enzymes: Recent Labs  Lab 2018-08-28 1630 2018-08-28 2235 12/13/18 0444 12/13/18 1023  TROPONINI 0.09* 0.09* 0.09* 0.08*   Sepsis Labs: Recent Labs  Lab 12/14/18 0544 12/14/18 0820 12/15/18 0500 12/16/18 0353 12/16/18 1645 12/17/18 0408 01/05/2019 0400  WBC  --   --  7.9 22.8*  --  37.8* 44.9*  LATICACIDVEN 2.1* 2.0*  --   --  2.8*  --  9.1*    Procedures/Operations  Mechanical ventilation.   Omir Cooprider 12/19/2018, 2:24 PM

## 2019-01-12 NOTE — Procedures (Signed)
Extubation Procedure Note  Patient Details:   Name: Daniel Price DOB: 1950/12/05 MRN: [id number]   Airway Documentation:  Airway 7.5 mm (Active)  Secured at (cm) 27 cm 2019-01-13  7:53 AM  Measured From Lips 2019/01/13  7:53 AM  Secured Location Center Jan 13, 2019  7:53 AM  Secured By [employer] 2019-01-13  7:53 AM  Tube Holder Repositioned Yes Jan 13, 2019  7:53 AM  Cuff Pressure (cm H2O) 26 cm H2O 12/17/2018  7:39 PM  Site Condition Dry 01/13/2019  7:53 AM   Vent end date: 12/15/18 Vent end time: 0001   Evaluation  O2 sats: currently acceptable Complications: No apparent complications Patient (terminal wean)  Bilateral Breath Sounds: Diminished   Withdrawal of care.  Erinn Mendosa 01-13-19, 12:49 PM

## 2019-01-12 NOTE — Progress Notes (Signed)
eLink Physician-Brief Progress Note Patient Name: Daniel Price DOB: September 12, 1950 MRN: [id number]   Date of Service  21-Dec-2018  HPI/Events of Note  Metabolic acidosis with PH 7.08, ionized calcium low   eICU Interventions  Will begin a sodium bicarbonate infusion, Calcium gluconate 2 gm iv x 1        [place]or[place]wo U Ogan 21-Dec-2018, 2:27 AM

## 2019-01-12 NOTE — Telephone Encounter (Signed)
Received original D/C from [facility]/D/C forwarded to Dr. Denese Killings (2H) to sign for Dr.Ellison.

## 2019-01-12 NOTE — Progress Notes (Signed)
  Patient continued to deteriorate throughout the am. Now on NE 100 and Epi 25. Increasingly mottled. Unresponsive.   Discussed with his sister and niece at bedside.   Have decided to proceed with comfort care and terminal extubation.   I gave the him 200 mcg fentanyl bolus and then terminally extubated him personally at 1140a.   He passed quietly at 1150a  Additional CCT 35 mins.   Arvilla Meres, MD  11:55 AM

## 2019-01-12 NOTE — Progress Notes (Signed)
Pt cell phone walked by this RN to the main entrance of hospital and given to security. Pt niece, Esau Grew, will pick the cell phone today.

## 2019-01-12 DEATH — deceased

## 2019-01-13 LAB — CULTURE, RESPIRATORY W GRAM STAIN

## 2020-03-17 IMAGING — DX PORTABLE CHEST - 1 VIEW
1 series · 1 of 1 positions shown · non-contrast
Comparison: 01/14/2005

CLINICAL DATA: Shortness of breath.

EXAM:
PORTABLE CHEST 1 VIEW

[chest]
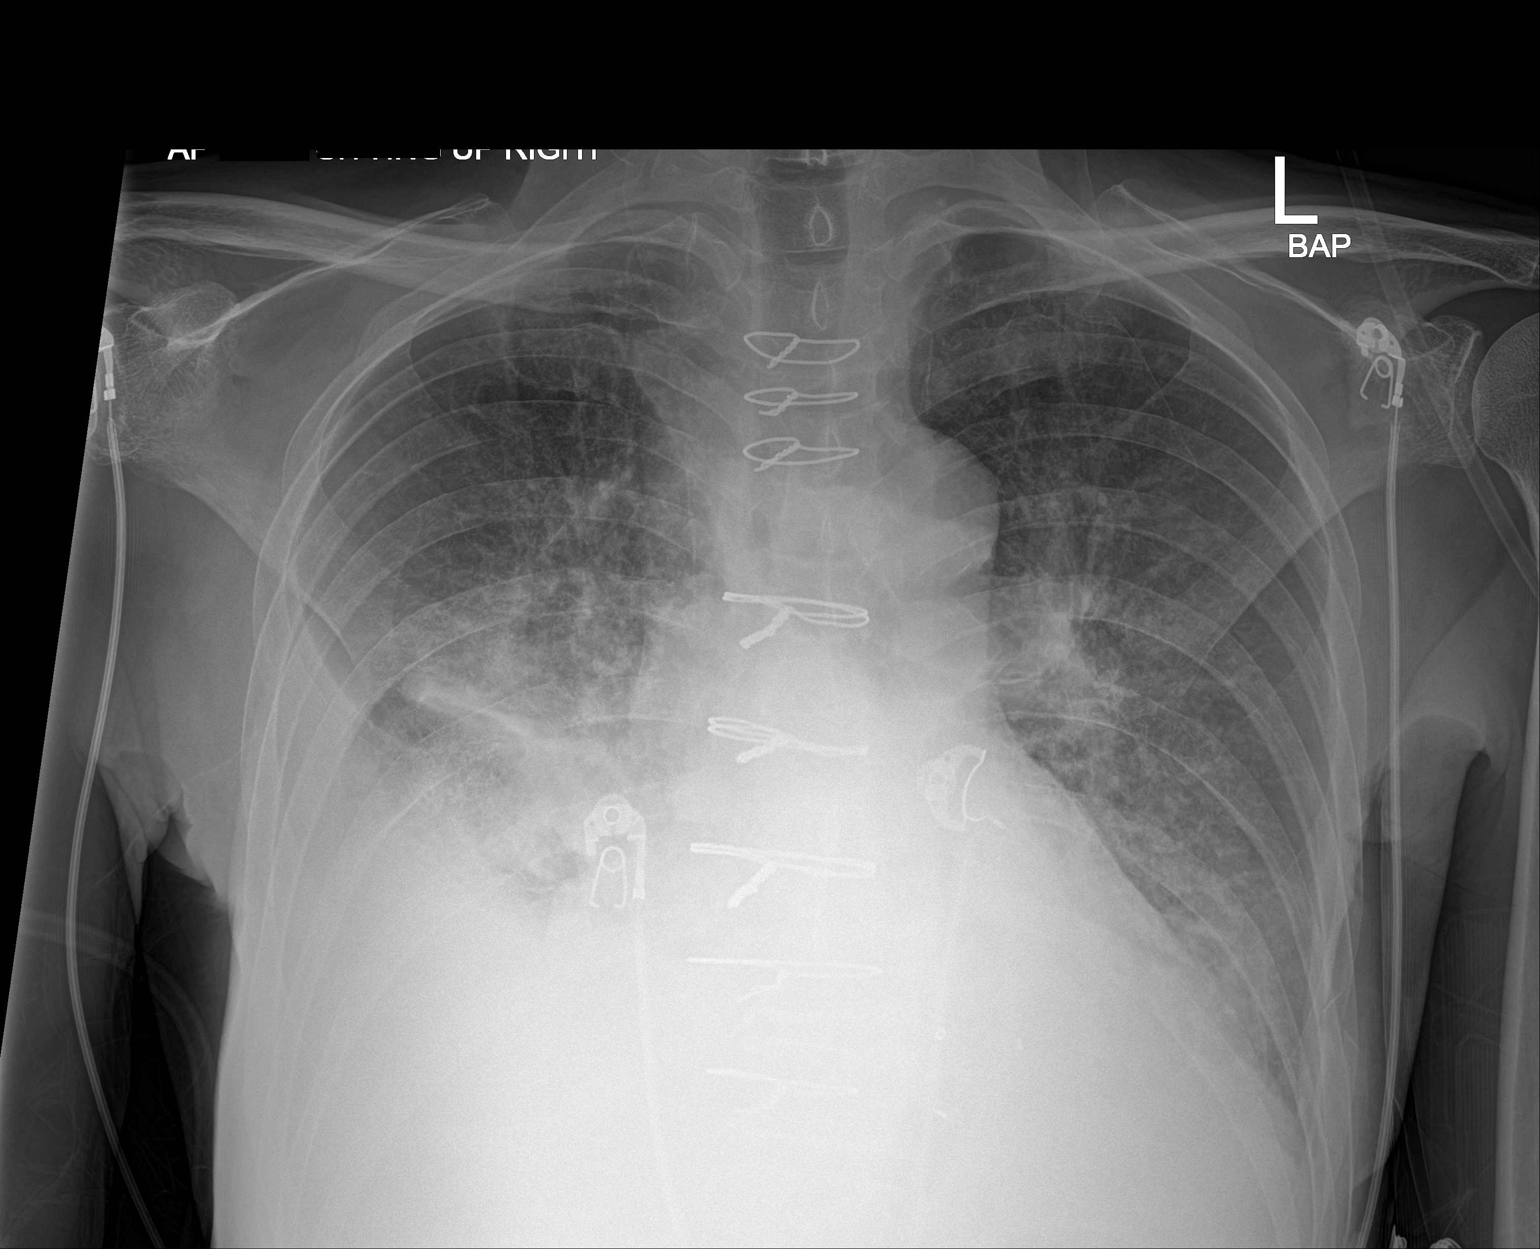

[1 of 1 positions shown; findings below may reference images not displayed]

FINDINGS: There is a moderate right effusion and a small left effusion. There
is bilateral interstitial pulmonary edema. Cardiomegaly. Pulmonary
vascularity is slightly prominent. No acute bone abnormality. Median
sternotomy.
IMPRESSION: 1. Bilateral pulmonary edema with bilateral pleural effusions, right
greater than left.
2. Cardiomegaly with mild pulmonary vascular congestion. Findings
are consistent with congestive heart failure.

## 2020-03-18 IMAGING — DX PORTABLE CHEST - 1 VIEW
1 series · 1 of 1 positions shown · non-contrast
Comparison: 12/12/2018

CLINICAL DATA: Shortness of breath, labored breathing

EXAM:
PORTABLE CHEST 1 VIEW

[chest ap]
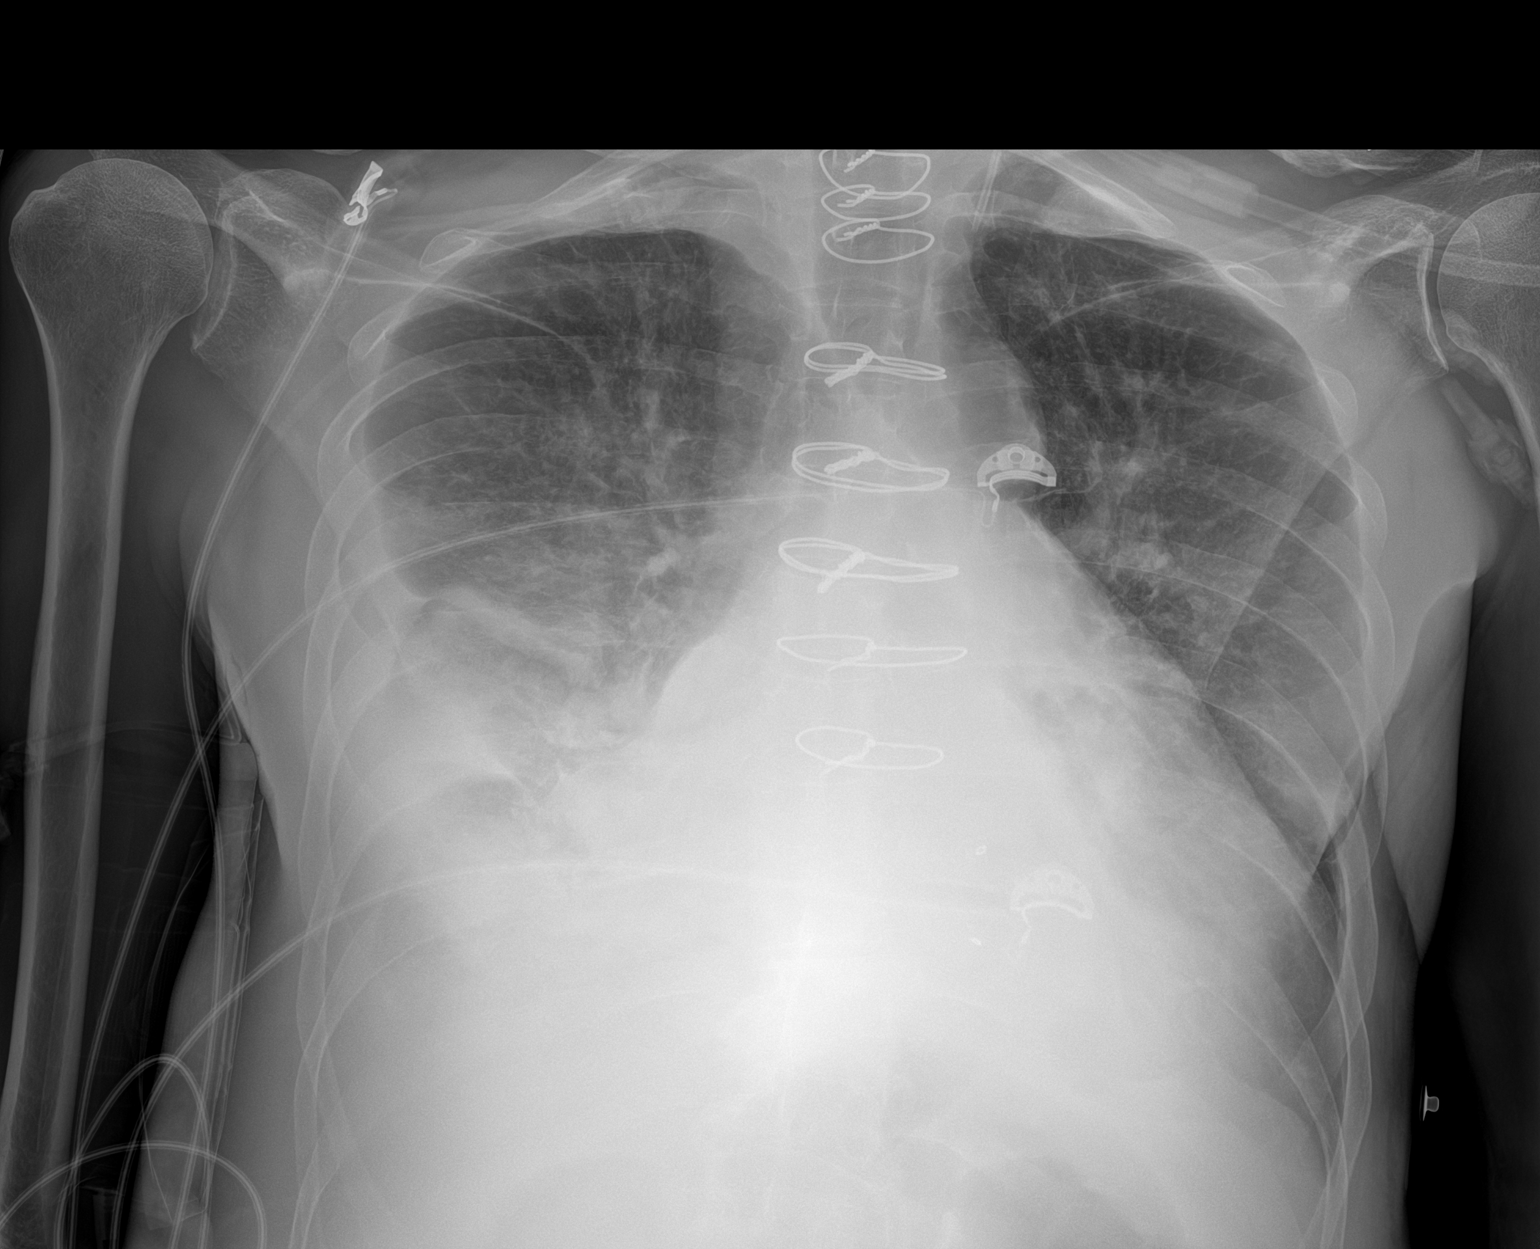

[1 of 1 positions shown; findings below may reference images not displayed]

FINDINGS: Small right pleural effusion. Bilateral interstitial and patchy
alveolar airspace opacities. No pneumothorax. Stable cardiomegaly.
Prior median sternotomy. No acute osseous abnormality.
IMPRESSION: Mild pulmonary edema.

## 2020-03-19 IMAGING — CR PORTABLE CHEST - 1 VIEW
1 series · 1 of 1 positions shown · non-contrast
Comparison: 12/13/2018

CLINICAL DATA: ET tube

EXAM:
PORTABLE CHEST 1 VIEW

[AP]
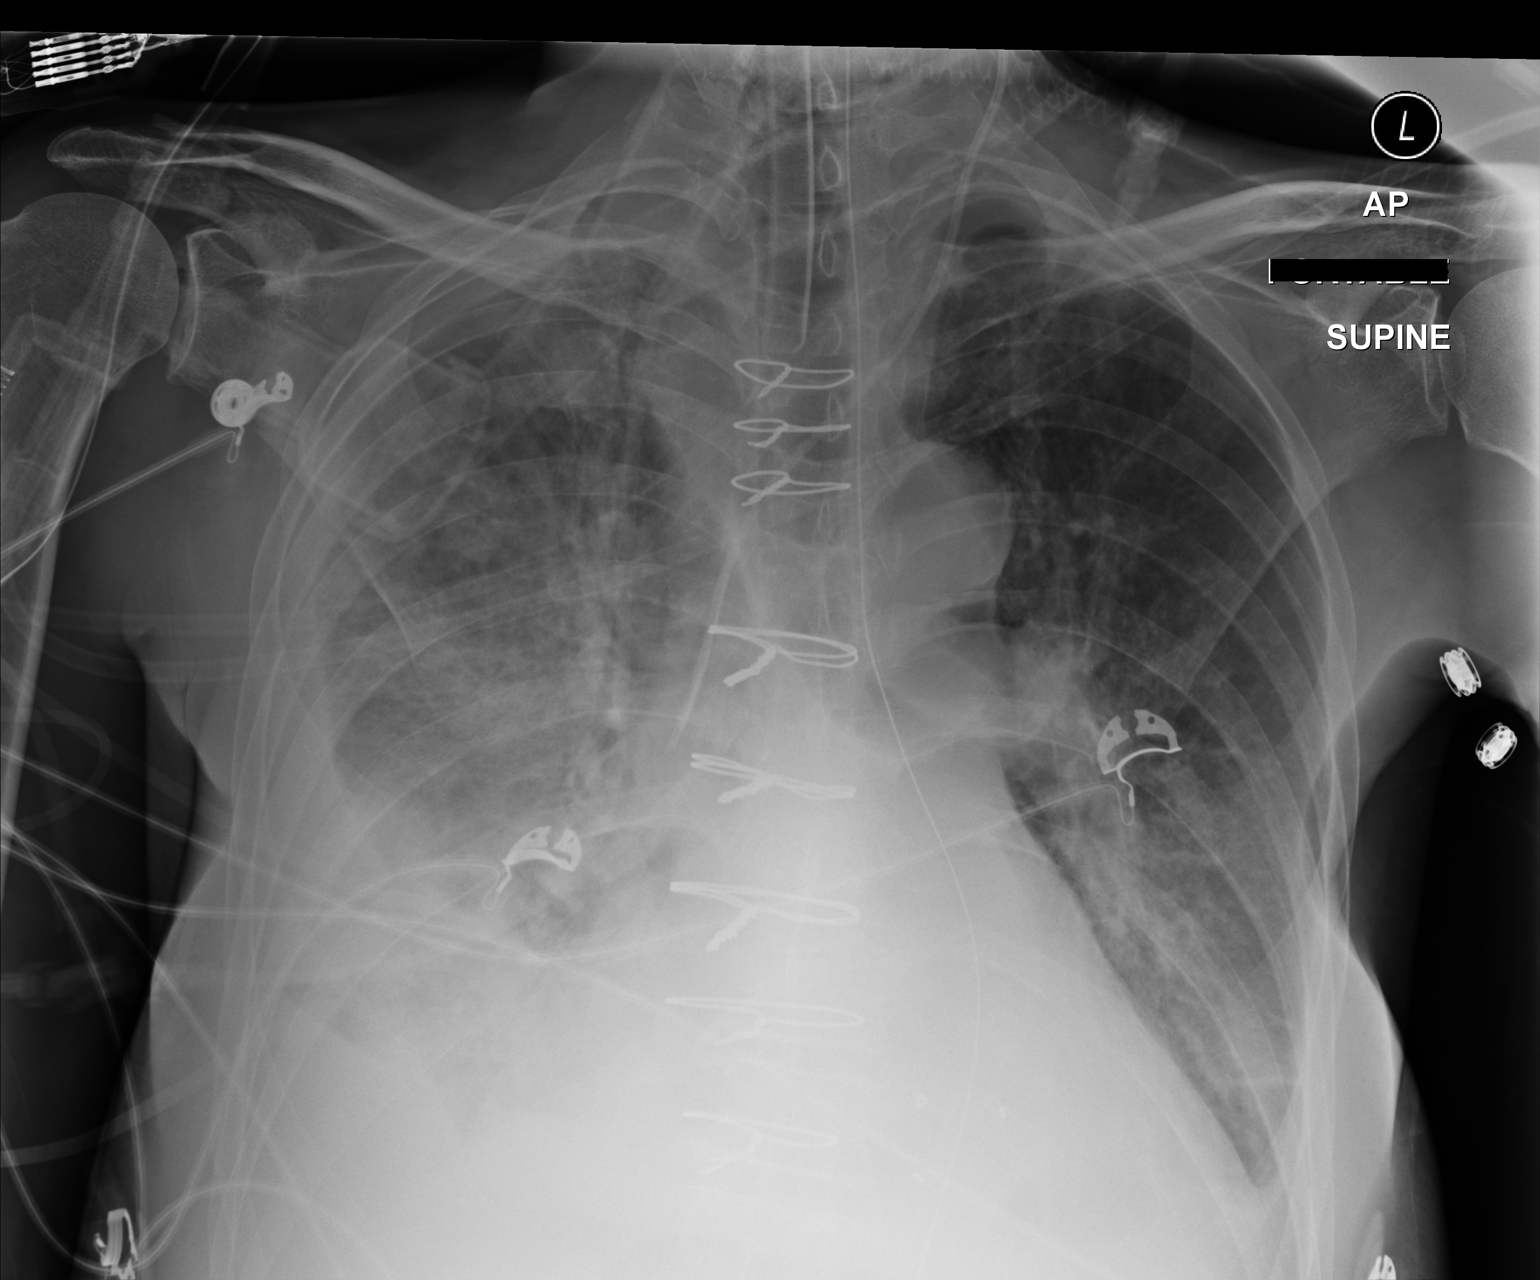

[1 of 1 positions shown; findings below may reference images not displayed]

FINDINGS: Moderate to large right pleural effusion and small left pleural
effusion. Bilateral airspace disease, right greater than left. There
is cardiomegaly.

Left central line remains in place, unchanged. Interval placement
endotracheal tube with the tip 8 cm above the carina. NG tube enters
the stomach.
IMPRESSION: Endotracheal tube 8 cm above the carina.

Bilateral effusions and airspace disease, right greater than left,
similar to prior study.

## 2020-03-22 IMAGING — DX PORTABLE CHEST - 1 VIEW
1 series · 1 of 1 positions shown · non-contrast
Comparison: Prior chest x-ray 12/15/2018

CLINICAL DATA: 67-year-old male status post mitral valve
replacement with respiratory failure

EXAM:
PORTABLE CHEST 1 VIEW

[chest ap]
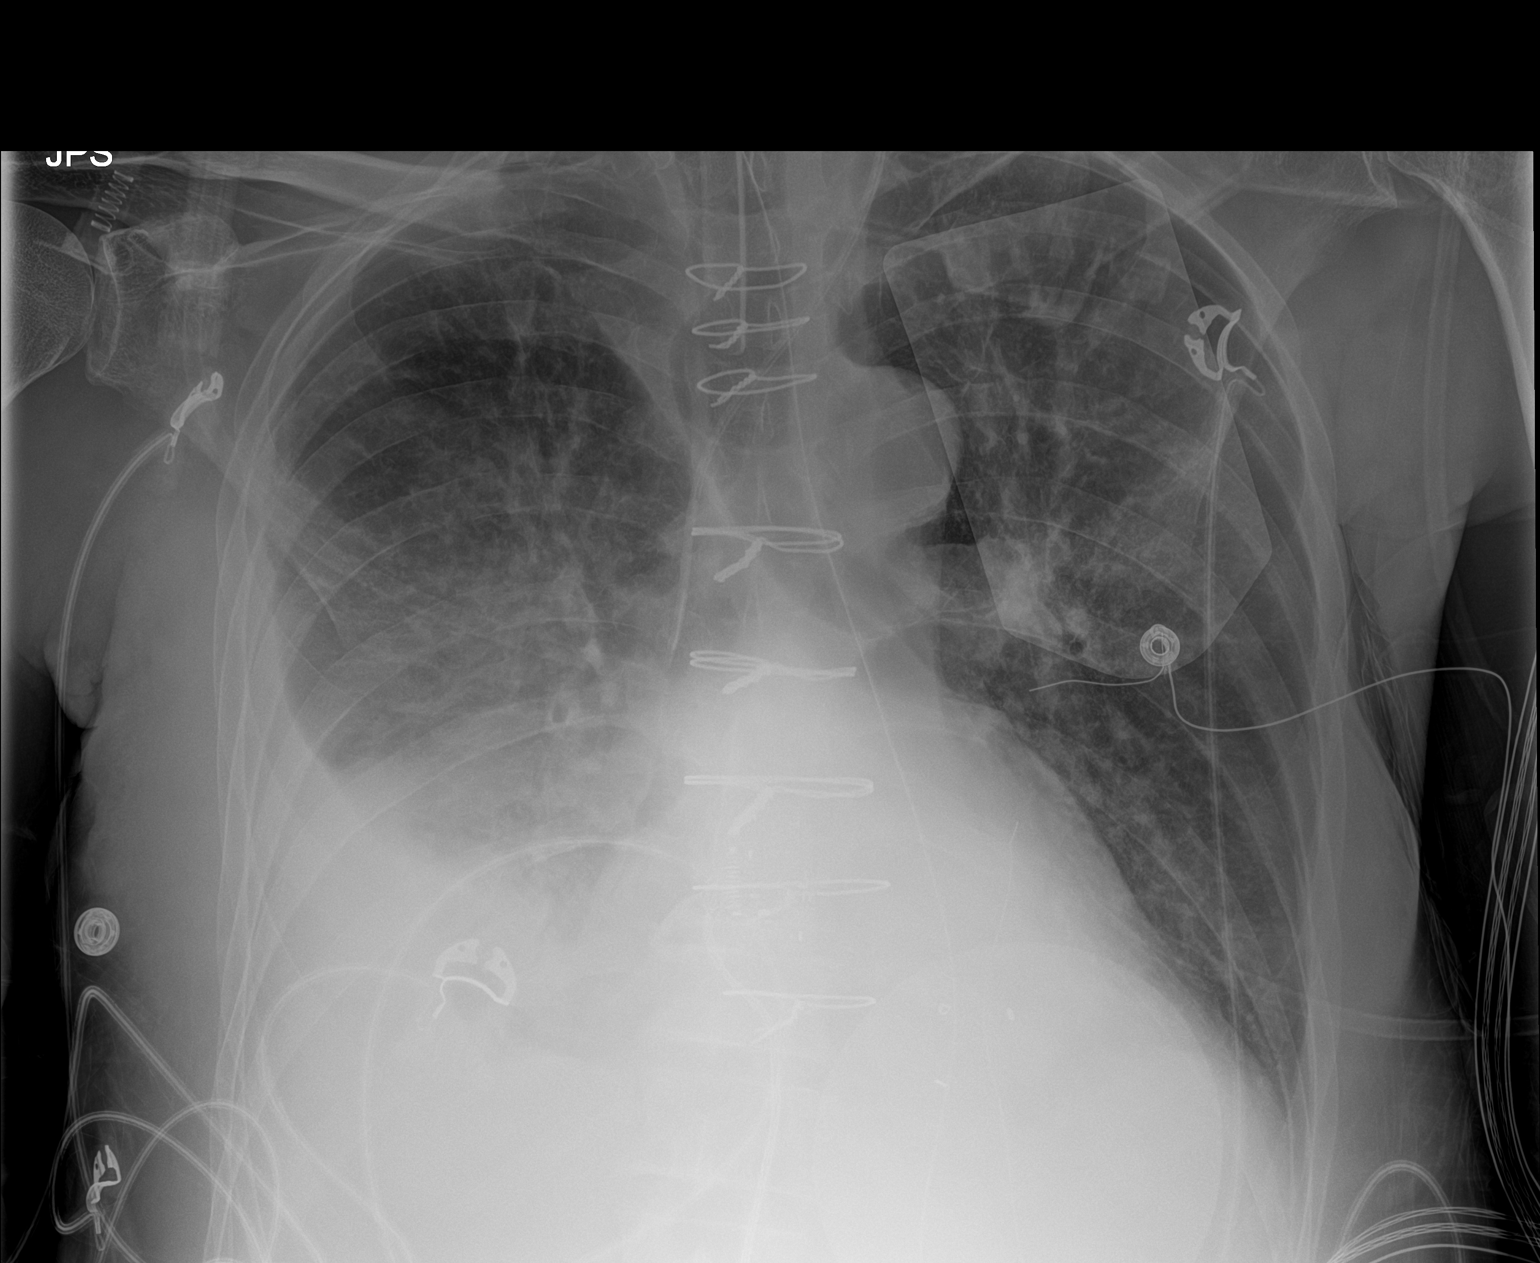

[1 of 1 positions shown; findings below may reference images not displayed]

FINDINGS: The tip of the endotracheal tube is 5.3 cm above the carina. Left IJ
central venous catheter in stable position with the tip overlying
the lower SVC. Gastric tube is present. The tip is not identified
but lies below the diaphragm, presumably within the stomach.
External defibrillator pads project over the chest. Patient is
status post median sternotomy. Stable cardiac and mediastinal
contours. Persistent moderate right pleural effusion with associated
right basilar atelectasis. Mild pulmonary vascular congestion
bordering on mild interstitial edema remains unchanged. Left
retrocardiac opacity may reflect atelectasis or infiltrate. No
pneumothorax. Overall, no significant interval change in the
appearance of the chest.
IMPRESSION: 1. Overall, no significant interval change in the appearance of the
chest compared to 12/15/2018.
2. Persistent moderate layering right pleural effusion with
associated right basilar atelectasis.
3. Persistent left retrocardiac airspace opacity which may reflect
atelectasis and/or infiltrate. Smaller left effusion not excluded.
4. Pulmonary vascular congestion bordering on mild interstitial
edema.
5. Stable and satisfactory support apparatus.

## 2020-03-23 IMAGING — DX PORTABLE CHEST - 1 VIEW
1 series · 1 of 1 positions shown · non-contrast
Comparison: Multiple prior, most recent 12/17/2018

CLINICAL DATA: 67-year-old male, admission with intubation

EXAM:
PORTABLE CHEST 1 VIEW

[chest ap]
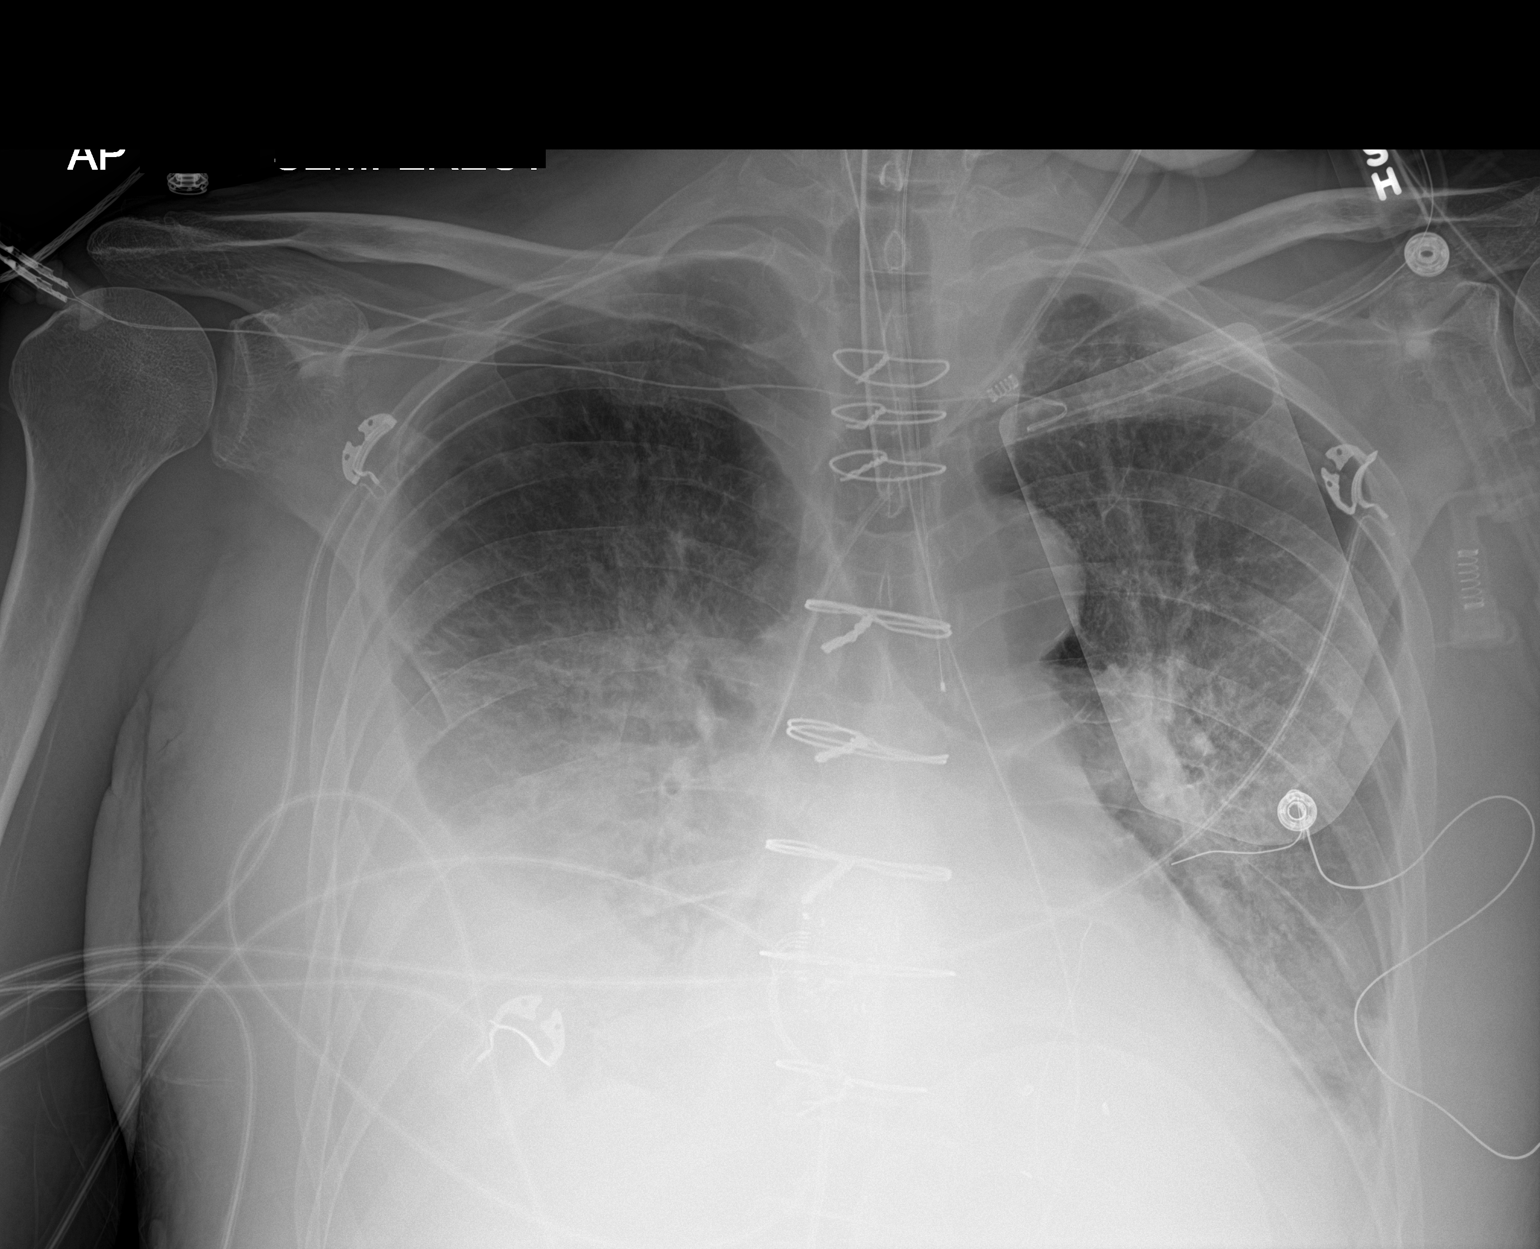

[1 of 1 positions shown; findings below may reference images not displayed]

FINDINGS: Cardiomediastinal silhouette likely unchanged partially obscured by
overlying lung and pleural disease. Surgical changes of median
sternotomy.

Defibrillator pads are unchanged.

Endotracheal tube terminates 5.7 cm above the carina.

Gastric tube projects over the mediastinum terminates out of the
field of view. Unchanged left IJ central venous catheter which
appears to terminate superior vena cava.

Similar appearance of dense opacity at the right lung base obscuring
the right hemidiaphragm and the right heart border with
pleuroparenchymal thickening. Blunting of left costophrenic angle.

Similar appearance of mixed interstitial airspace disease of the
bilateral lungs, worst on the right.

No pneumothorax.
IMPRESSION: Unchanged endotracheal tube, gastric tube, and left IJ central
venous catheter.

Right pleural effusion with associated atelectasis/consolidation
with trace left pleural effusion.

Similar appearance of mixed interstitial and airspace opacities.
